# Patient Record
Sex: Female | Born: 1945 | Race: Black or African American | Hispanic: No | Smoking: Never smoker
Health system: Southern US, Community
[De-identification: ages and names within clinical notes are randomized; demographics above are authoritative.]

## PROBLEM LIST (undated history)

## (undated) DIAGNOSIS — E7800 Pure hypercholesterolemia, unspecified: Secondary | ICD-10-CM

## (undated) DIAGNOSIS — I428 Other cardiomyopathies: Secondary | ICD-10-CM

## (undated) DIAGNOSIS — I10 Essential (primary) hypertension: Secondary | ICD-10-CM

## (undated) DIAGNOSIS — N183 Chronic kidney disease, stage 3 unspecified: Secondary | ICD-10-CM

## (undated) DIAGNOSIS — D649 Anemia, unspecified: Secondary | ICD-10-CM

## (undated) DIAGNOSIS — I5042 Chronic combined systolic (congestive) and diastolic (congestive) heart failure: Secondary | ICD-10-CM

## (undated) DIAGNOSIS — I351 Nonrheumatic aortic (valve) insufficiency: Secondary | ICD-10-CM

## (undated) DIAGNOSIS — I340 Nonrheumatic mitral (valve) insufficiency: Secondary | ICD-10-CM

## (undated) DIAGNOSIS — Z9289 Personal history of other medical treatment: Secondary | ICD-10-CM

## (undated) DIAGNOSIS — M1990 Unspecified osteoarthritis, unspecified site: Secondary | ICD-10-CM

## (undated) DIAGNOSIS — I2510 Atherosclerotic heart disease of native coronary artery without angina pectoris: Secondary | ICD-10-CM

## (undated) DIAGNOSIS — M109 Gout, unspecified: Secondary | ICD-10-CM

## (undated) DIAGNOSIS — E119 Type 2 diabetes mellitus without complications: Secondary | ICD-10-CM

## (undated) DIAGNOSIS — I509 Heart failure, unspecified: Secondary | ICD-10-CM

## (undated) HISTORY — PX: CARPAL TUNNEL RELEASE: SHX101

## (undated) HISTORY — PX: MENISCUS REPAIR: SHX5179

## (undated) HISTORY — DX: Nonrheumatic aortic (valve) insufficiency: I35.1

## (undated) HISTORY — PX: TUBAL LIGATION: SHX77

## (undated) HISTORY — DX: Nonrheumatic mitral (valve) insufficiency: I34.0

## (undated) HISTORY — DX: Atherosclerotic heart disease of native coronary artery without angina pectoris: I25.10

## (undated) HISTORY — DX: Chronic combined systolic (congestive) and diastolic (congestive) heart failure: I50.42

## (undated) HISTORY — PX: THYROID LOBECTOMY: SHX420

## (undated) HISTORY — DX: Chronic kidney disease, stage 3 unspecified: N18.30

## (undated) HISTORY — PX: JOINT REPLACEMENT: SHX530

## (undated) HISTORY — PX: ABDOMINAL HYSTERECTOMY: SHX81

---

## 1998-03-17 HISTORY — PX: TOTAL HIP ARTHROPLASTY: SHX124

## 2004-01-12 ENCOUNTER — Ambulatory Visit: Payer: Self-pay | Admitting: Family Medicine

## 2004-01-29 ENCOUNTER — Ambulatory Visit: Payer: Self-pay | Admitting: Pulmonary Disease

## 2004-01-29 ENCOUNTER — Ambulatory Visit (HOSPITAL_BASED_OUTPATIENT_CLINIC_OR_DEPARTMENT_OTHER): Admission: RE | Admit: 2004-01-29 | Discharge: 2004-01-29 | Payer: Self-pay | Admitting: Family Medicine

## 2004-02-13 ENCOUNTER — Ambulatory Visit: Payer: Self-pay | Admitting: Family Medicine

## 2004-10-24 ENCOUNTER — Ambulatory Visit: Payer: Self-pay | Admitting: Family Medicine

## 2004-11-06 ENCOUNTER — Ambulatory Visit: Payer: Self-pay | Admitting: Family Medicine

## 2005-01-09 ENCOUNTER — Ambulatory Visit: Payer: Self-pay | Admitting: Family Medicine

## 2005-01-21 ENCOUNTER — Ambulatory Visit: Payer: Self-pay | Admitting: Family Medicine

## 2005-03-28 ENCOUNTER — Ambulatory Visit: Payer: Self-pay | Admitting: Family Medicine

## 2005-06-04 ENCOUNTER — Ambulatory Visit: Payer: Self-pay | Admitting: Family Medicine

## 2015-05-04 DIAGNOSIS — M109 Gout, unspecified: Secondary | ICD-10-CM | POA: Insufficient documentation

## 2015-06-03 DIAGNOSIS — I10 Essential (primary) hypertension: Secondary | ICD-10-CM | POA: Insufficient documentation

## 2015-06-03 DIAGNOSIS — G4733 Obstructive sleep apnea (adult) (pediatric): Secondary | ICD-10-CM | POA: Insufficient documentation

## 2015-06-03 DIAGNOSIS — E782 Mixed hyperlipidemia: Secondary | ICD-10-CM | POA: Insufficient documentation

## 2015-06-22 ENCOUNTER — Emergency Department (HOSPITAL_COMMUNITY): Payer: Medicare Other

## 2015-06-22 ENCOUNTER — Encounter (HOSPITAL_COMMUNITY): Payer: Self-pay

## 2015-06-22 ENCOUNTER — Inpatient Hospital Stay (HOSPITAL_COMMUNITY)
Admission: EM | Admit: 2015-06-22 | Discharge: 2015-06-27 | DRG: 287 | Disposition: A | Discharge status: Home / Self Care | Payer: Medicare Other | Attending: Cardiology | Admitting: Cardiology

## 2015-06-22 DIAGNOSIS — R000 Tachycardia, unspecified: Secondary | ICD-10-CM | POA: Diagnosis not present

## 2015-06-22 DIAGNOSIS — I272 Other secondary pulmonary hypertension: Secondary | ICD-10-CM | POA: Diagnosis present

## 2015-06-22 DIAGNOSIS — R7989 Other specified abnormal findings of blood chemistry: Secondary | ICD-10-CM

## 2015-06-22 DIAGNOSIS — I420 Dilated cardiomyopathy: Secondary | ICD-10-CM | POA: Diagnosis present

## 2015-06-22 DIAGNOSIS — Z7984 Long term (current) use of oral hypoglycemic drugs: Secondary | ICD-10-CM | POA: Diagnosis not present

## 2015-06-22 DIAGNOSIS — I509 Heart failure, unspecified: Secondary | ICD-10-CM

## 2015-06-22 DIAGNOSIS — R0602 Shortness of breath: Secondary | ICD-10-CM | POA: Diagnosis not present

## 2015-06-22 DIAGNOSIS — Z888 Allergy status to other drugs, medicaments and biological substances status: Secondary | ICD-10-CM

## 2015-06-22 DIAGNOSIS — I5021 Acute systolic (congestive) heart failure: Secondary | ICD-10-CM

## 2015-06-22 DIAGNOSIS — I214 Non-ST elevation (NSTEMI) myocardial infarction: Secondary | ICD-10-CM | POA: Diagnosis not present

## 2015-06-22 DIAGNOSIS — I5041 Acute combined systolic (congestive) and diastolic (congestive) heart failure: Secondary | ICD-10-CM | POA: Diagnosis not present

## 2015-06-22 DIAGNOSIS — I110 Hypertensive heart disease with heart failure: Principal | ICD-10-CM

## 2015-06-22 DIAGNOSIS — E118 Type 2 diabetes mellitus with unspecified complications: Secondary | ICD-10-CM | POA: Diagnosis present

## 2015-06-22 DIAGNOSIS — Z79899 Other long term (current) drug therapy: Secondary | ICD-10-CM | POA: Diagnosis not present

## 2015-06-22 DIAGNOSIS — E785 Hyperlipidemia, unspecified: Secondary | ICD-10-CM | POA: Diagnosis present

## 2015-06-22 DIAGNOSIS — I313 Pericardial effusion (noninflammatory): Secondary | ICD-10-CM | POA: Diagnosis present

## 2015-06-22 DIAGNOSIS — M109 Gout, unspecified: Secondary | ICD-10-CM | POA: Diagnosis present

## 2015-06-22 DIAGNOSIS — D649 Anemia, unspecified: Secondary | ICD-10-CM | POA: Diagnosis present

## 2015-06-22 DIAGNOSIS — I2510 Atherosclerotic heart disease of native coronary artery without angina pectoris: Secondary | ICD-10-CM | POA: Diagnosis not present

## 2015-06-22 DIAGNOSIS — R0789 Other chest pain: Secondary | ICD-10-CM | POA: Diagnosis not present

## 2015-06-22 DIAGNOSIS — I10 Essential (primary) hypertension: Secondary | ICD-10-CM | POA: Diagnosis present

## 2015-06-22 DIAGNOSIS — E1169 Type 2 diabetes mellitus with other specified complication: Secondary | ICD-10-CM | POA: Diagnosis present

## 2015-06-22 DIAGNOSIS — E876 Hypokalemia: Secondary | ICD-10-CM | POA: Diagnosis present

## 2015-06-22 DIAGNOSIS — Z96642 Presence of left artificial hip joint: Secondary | ICD-10-CM | POA: Diagnosis present

## 2015-06-22 DIAGNOSIS — R778 Other specified abnormalities of plasma proteins: Secondary | ICD-10-CM

## 2015-06-22 HISTORY — DX: Pure hypercholesterolemia, unspecified: E78.00

## 2015-06-22 HISTORY — DX: Unspecified osteoarthritis, unspecified site: M19.90

## 2015-06-22 HISTORY — DX: Essential (primary) hypertension: I10

## 2015-06-22 HISTORY — DX: Type 2 diabetes mellitus without complications: E11.9

## 2015-06-22 HISTORY — DX: Personal history of other medical treatment: Z92.89

## 2015-06-22 HISTORY — DX: Gout, unspecified: M10.9

## 2015-06-22 HISTORY — DX: Anemia, unspecified: D64.9

## 2015-06-22 HISTORY — DX: Other cardiomyopathies: I42.8

## 2015-06-22 HISTORY — DX: Heart failure, unspecified: I50.9

## 2015-06-22 LAB — BASIC METABOLIC PANEL
Anion gap: 13 (ref 5–15)
BUN: 13 mg/dL (ref 6–20)
CO2: 23 mmol/L (ref 22–32)
Calcium: 9.8 mg/dL (ref 8.9–10.3)
Chloride: 106 mmol/L (ref 101–111)
Creatinine, Ser: 1.07 mg/dL — ABNORMAL HIGH (ref 0.44–1.00)
GFR calc Af Amer: 60 mL/min — ABNORMAL LOW (ref >60)
GFR calc non Af Amer: 51 mL/min — ABNORMAL LOW (ref >60)
Glucose, Bld: 152 mg/dL — ABNORMAL HIGH (ref 65–99)
Potassium: 3.1 mmol/L — ABNORMAL LOW (ref 3.5–5.1)
Sodium: 142 mmol/L (ref 135–145)

## 2015-06-22 LAB — CBC
HCT: 37 % (ref 36.0–46.0)
Hemoglobin: 13 g/dL (ref 12.0–15.0)
MCH: 30.2 pg (ref 26.0–34.0)
MCHC: 35.1 g/dL (ref 30.0–36.0)
MCV: 85.8 fL (ref 78.0–100.0)
Platelets: 327 10*3/uL (ref 150–400)
RBC: 4.31 MIL/uL (ref 3.87–5.11)
RDW: 17.7 % — ABNORMAL HIGH (ref 11.5–15.5)
WBC: 6.3 10*3/uL (ref 4.0–10.5)

## 2015-06-22 LAB — I-STAT TROPONIN, ED: Troponin i, poc: 0.15 ng/mL (ref 0.00–0.08)

## 2015-06-22 LAB — TROPONIN I
TROPONIN I: 0.13 ng/mL — AB (ref <0.031)
Troponin I: 0.28 ng/mL — ABNORMAL HIGH (ref <0.031)

## 2015-06-22 LAB — ECHOCARDIOGRAM COMPLETE
Height: 61 in
Weight: 3056 oz

## 2015-06-22 LAB — GLUCOSE, CAPILLARY: Glucose-Capillary: 134 mg/dL — ABNORMAL HIGH (ref 65–99)

## 2015-06-22 LAB — BRAIN NATRIURETIC PEPTIDE: B Natriuretic Peptide: 1001.5 pg/mL — ABNORMAL HIGH (ref 0.0–100.0)

## 2015-06-22 LAB — HEPARIN LEVEL (UNFRACTIONATED): HEPARIN UNFRACTIONATED: 0.36 [IU]/mL (ref 0.30–0.70)

## 2015-06-22 MED ORDER — HEPARIN BOLUS VIA INFUSION
4000.0000 [IU] | Freq: Once | INTRAVENOUS | Status: AC
  Administered 2015-06-22: 4000 [IU] via INTRAVENOUS
  Filled 2015-06-22: qty 4000

## 2015-06-22 MED ORDER — NITROGLYCERIN IN D5W 200-5 MCG/ML-% IV SOLN
0.0000 ug/min | Freq: Once | INTRAVENOUS | Status: AC
  Administered 2015-06-22: 10 ug/min via INTRAVENOUS
  Filled 2015-06-22: qty 250

## 2015-06-22 MED ORDER — ONDANSETRON HCL 4 MG/2ML IJ SOLN: 4.0000 mg | Freq: Four times a day (QID) | INTRAMUSCULAR | Status: DC | PRN

## 2015-06-22 MED ORDER — HEPARIN (PORCINE) IN NACL 100-0.45 UNIT/ML-% IJ SOLN
1150.0000 [IU]/h | INTRAMUSCULAR | Status: DC
  Administered 2015-06-22: 850 [IU]/h via INTRAVENOUS
  Filled 2015-06-22 (×3): qty 250

## 2015-06-22 MED ORDER — ATORVASTATIN CALCIUM 20 MG PO TABS
20.0000 mg | ORAL_TABLET | Freq: Every day | ORAL | Status: DC
  Administered 2015-06-23 – 2015-06-26 (×4): 20 mg via ORAL
  Filled 2015-06-22 (×4): qty 1

## 2015-06-22 MED ORDER — SODIUM CHLORIDE 0.9% FLUSH
3.0000 mL | Freq: Two times a day (BID) | INTRAVENOUS | Status: DC
  Administered 2015-06-25 – 2015-06-26 (×2): 3 mL via INTRAVENOUS

## 2015-06-22 MED ORDER — ASPIRIN 81 MG PO CHEW: 81.0000 mg | CHEWABLE_TABLET | ORAL | Status: DC

## 2015-06-22 MED ORDER — SODIUM CHLORIDE 0.9% FLUSH
3.0000 mL | Freq: Two times a day (BID) | INTRAVENOUS | Status: DC
  Administered 2015-06-23 – 2015-06-24 (×2): 3 mL via INTRAVENOUS

## 2015-06-22 MED ORDER — COLCHICINE 0.6 MG PO TABS: 0.6000 mg | ORAL_TABLET | Freq: Every day | ORAL | Status: DC | PRN

## 2015-06-22 MED ORDER — SODIUM CHLORIDE 0.9% FLUSH
3.0000 mL | INTRAVENOUS | Status: DC | PRN
Start: 2015-06-22 — End: 2015-06-27

## 2015-06-22 MED ORDER — NITROGLYCERIN IN D5W 200-5 MCG/ML-% IV SOLN: 0.0000 ug/min | Freq: Once | INTRAVENOUS | Status: DC

## 2015-06-22 MED ORDER — FEBUXOSTAT 40 MG PO TABS
40.0000 mg | ORAL_TABLET | Freq: Every day | ORAL | Status: DC
  Administered 2015-06-23 – 2015-06-27 (×4): 40 mg via ORAL
  Filled 2015-06-22 (×4): qty 1

## 2015-06-22 MED ORDER — POTASSIUM CHLORIDE CRYS ER 20 MEQ PO TBCR
60.0000 meq | EXTENDED_RELEASE_TABLET | Freq: Once | ORAL | Status: AC
  Administered 2015-06-22: 60 meq via ORAL
  Filled 2015-06-22: qty 3

## 2015-06-22 MED ORDER — METOPROLOL SUCCINATE ER 25 MG PO TB24
12.5000 mg | ORAL_TABLET | Freq: Every day | ORAL | Status: DC
  Administered 2015-06-22 – 2015-06-26 (×4): 12.5 mg via ORAL
  Filled 2015-06-22 (×4): qty 1

## 2015-06-22 MED ORDER — SODIUM CHLORIDE 0.9 % IV SOLN: 250.0000 mL | INTRAVENOUS | Status: DC | PRN

## 2015-06-22 MED ORDER — FUROSEMIDE 10 MG/ML IJ SOLN
60.0000 mg | Freq: Once | INTRAMUSCULAR | Status: AC
  Administered 2015-06-22: 60 mg via INTRAVENOUS
  Filled 2015-06-22: qty 6

## 2015-06-22 MED ORDER — ASPIRIN EC 81 MG PO TBEC
81.0000 mg | DELAYED_RELEASE_TABLET | Freq: Every day | ORAL | Status: DC
  Administered 2015-06-22 – 2015-06-27 (×5): 81 mg via ORAL
  Filled 2015-06-22 (×5): qty 1

## 2015-06-22 MED ORDER — POTASSIUM CHLORIDE CRYS ER 20 MEQ PO TBCR
40.0000 meq | EXTENDED_RELEASE_TABLET | Freq: Every day | ORAL | Status: DC
  Administered 2015-06-22 – 2015-06-27 (×5): 40 meq via ORAL
  Filled 2015-06-22 (×6): qty 2

## 2015-06-22 MED ORDER — SODIUM CHLORIDE 0.9 % IV SOLN
250.0000 mL | INTRAVENOUS | Status: DC | PRN
Start: 2015-06-22 — End: 2015-06-27

## 2015-06-22 MED ORDER — FUROSEMIDE 10 MG/ML IJ SOLN
60.0000 mg | Freq: Every day | INTRAMUSCULAR | Status: DC
  Administered 2015-06-22 – 2015-06-26 (×4): 60 mg via INTRAVENOUS
  Filled 2015-06-22 (×4): qty 6

## 2015-06-22 MED ORDER — NITROGLYCERIN 0.4 MG SL SUBL: 0.4000 mg | SUBLINGUAL_TABLET | SUBLINGUAL | Status: DC | PRN

## 2015-06-22 MED ORDER — HEPARIN SODIUM (PORCINE) 5000 UNIT/ML IJ SOLN: 5000.0000 [IU] | Freq: Three times a day (TID) | INTRAMUSCULAR | Status: DC

## 2015-06-22 MED ORDER — NITROGLYCERIN IN D5W 200-5 MCG/ML-% IV SOLN
0.0000 ug/min | INTRAVENOUS | Status: DC
Start: 2015-06-22 — End: 2015-06-24
  Administered 2015-06-22: 30 ug/min via INTRAVENOUS
  Filled 2015-06-22 (×2): qty 250

## 2015-06-22 MED ORDER — ACETAMINOPHEN 325 MG PO TABS
650.0000 mg | ORAL_TABLET | ORAL | Status: DC | PRN
  Administered 2015-06-22 – 2015-06-26 (×6): 650 mg via ORAL
  Filled 2015-06-22 (×6): qty 2

## 2015-06-22 MED ORDER — ASPIRIN 81 MG PO CHEW: 324.0000 mg | CHEWABLE_TABLET | Freq: Once | ORAL | Status: DC

## 2015-06-22 MED ORDER — SODIUM CHLORIDE 0.9 % IV SOLN: INTRAVENOUS | Status: DC

## 2015-06-22 MED ORDER — IRBESARTAN 300 MG PO TABS
300.0000 mg | ORAL_TABLET | Freq: Every day | ORAL | Status: DC
  Administered 2015-06-23 – 2015-06-27 (×4): 300 mg via ORAL
  Filled 2015-06-22 (×4): qty 1

## 2015-06-22 MED ORDER — SODIUM CHLORIDE 0.9% FLUSH: 3.0000 mL | INTRAVENOUS | Status: DC | PRN

## 2015-06-22 NOTE — ED Notes (Signed)
Dr. Juleen China, ED at bedside.

## 2015-06-22 NOTE — ED Notes (Signed)
Pt ambulatory w/ steady gait to restroom. 

## 2015-06-22 NOTE — Progress Notes (Signed)
ANTICOAGULATION CONSULT NOTE  Pharmacy Consult for heparin Indication: chest pain/ACS  Allergies  Allergen Reactions  . Atorvastatin     Other reaction(s): GI Upset (intolerance) Chest discomfort  . Carvedilol Other (See Comments)    myalgias  . Hydrochlorothiazide Other (See Comments)    hypokalemia  . Lisinopril Cough    Patient Measurements: Height: 5' 1.5" (156.2 cm) Weight: 188 lb 12.8 oz (85.639 kg) IBW/kg (Calculated) : 48.95 Heparin Dosing Weight: 67.8 kg  Vital Signs: Temp: 98.4 F (36.9 C) (04/07 1542) Temp Source: Oral (04/07 1542) BP: 127/87 mmHg (04/07 1542) Pulse Rate: 106 (04/07 1542)  Labs:  Recent Labs  06/22/15 1040 06/22/15 1904  HGB 13.0  --   HCT 37.0  --   PLT 327  --   HEPARINUNFRC  --  0.36  CREATININE 1.07*  --     Estimated Creatinine Clearance: 49.1 mL/min (by C-G formula based on Cr of 1.07).  Assessment: 70 yo F in ED from [facility] with complains of CP and SOB.  Mild elevation in troponin.   First HL in therapeutic range at 0.36units/mL. No bleed noted.  Goal of Therapy:  Heparin level 0.3-0.7 units/ml Monitor platelets by anticoagulation protocol: Yes   Plan:  Contnue heparin infusion at 850 units/hr Next HL with AM labs Daily HL and CBC Follow for cardiology plans  Ahmira Boisselle D. Maximiano Lott, PharmD, BCPS Clinical Pharmacist Pager: [phone] 06/22/2015 7:49 PM

## 2015-06-22 NOTE — ED Notes (Signed)
Per EMS - coming from [facility]. Pt c/o chest pain x few days, thought r/t cough. Pt 12-lead unremarkable. Sent to [facility] for further evaluation. Pt now also c/o shortness of breath, lungs clear. Hx HTN, diabetes, gout. Sinus tach, BP 158/96, hr 112  324mg  aspirin. Pain 4/10 to 0/10 to 1/10.

## 2015-06-22 NOTE — Progress Notes (Signed)
  Echocardiogram 2D Echocardiogram has been performed.  Jennifer Kaufman 06/22/2015, 3:12 PM

## 2015-06-22 NOTE — ED Provider Notes (Signed)
CSN: [id number]     Arrival date & time 06/22/15  1031 History   First MD Initiated Contact with Patient 06/22/15 1032     Chief Complaint  Patient presents with  . Chest Pain  . Shortness of Breath     (Consider location/radiation/quality/duration/timing/severity/associated sxs/prior Treatment) HPI   69yF with dyspnea. Onset about a week ago and progressive. Worse with now just minimal exertion. "Did not sleep" the past two nights. Orthopnea and nonproductive cough. Chest "discomfort" in the center of her chest. She says it's not really painful and questions wether the feeling is because she cannot quite catch her breath. No fever or chills. Some mild LE swelling. No LE/calf pain. Does not weigh herself regularly. Hx of DM, HTN, hyperlipidemia and gout. No known CAD. Does not have a cardiologist. PCP Dr. Sol Passer through [place].   Past Medical History  Diagnosis Date  . Hypertension   . Diabetes mellitus without complication (HCC)   . Gout    Past Surgical History  Procedure Laterality Date  . Total hip arthroplasty Left 2000   No family history on file. Social History  Substance Use Topics  . Smoking status: Never Smoker   . Smokeless tobacco: Not on file  . Alcohol Use: No   OB History    No data available     Review of Systems  All systems reviewed and negative, other than as noted in HPI.   Allergies  Atorvastatin; Carvedilol; Hydrochlorothiazide; and Lisinopril  Home Medications   Prior to Admission medications   Not on File   BP 161/108 mmHg  Pulse 115  Temp(Src) 98.6 F (37 C)  Resp 37  Ht  (1.549 m)  Wt 191 lb (86.637 kg)  BMI 36.11 kg/m2  SpO2 100% Physical Exam  Constitutional: She appears well-developed and well-nourished. No distress.  HENT:  Head: Normocephalic and atraumatic.  Eyes: Conjunctivae are normal. Right eye exhibits no discharge. Left eye exhibits no discharge.  Neck: Neck supple.  Cardiovascular: Regular rhythm and normal  heart sounds.  Exam reveals no gallop and no friction rub.   No murmur heard. Tachycardia. No murmur appreciated.  Pulmonary/Chest:  Tachypnea. Decreased breath sounds b/l bases.   Abdominal: Soft. She exhibits no distension. There is no tenderness.  Musculoskeletal: She exhibits edema. She exhibits no tenderness.  Neurological: She is alert.  Skin: Skin is warm and dry.  Psychiatric: She has a normal mood and affect. Her behavior is normal. Thought content normal.  Nursing note and vitals reviewed.   ED Course  Procedures (including critical care time)  CRITICAL CARE Performed by: Raeford Razor Total critical care time: 35 minutes Critical care time was exclusive of separately billable procedures and treating other patients. Critical care was necessary to treat or prevent imminent or life-threatening deterioration. Critical care was time spent personally by me on the following activities: development of treatment plan with patient and/or surrogate as well as nursing, discussions with consultants, evaluation of patient's response to treatment, examination of patient, obtaining history from patient or surrogate, ordering and performing treatments and interventions, ordering and review of laboratory studies, ordering and review of radiographic studies, pulse oximetry and re-evaluation of patient's condition.  Labs Review Labs Reviewed  BASIC METABOLIC PANEL - Abnormal; Notable for the following:    Potassium 3.1 (*)    Glucose, Bld 152 (*)    Creatinine, Ser 1.07 (*)    GFR calc non Af Amer 51 (*)    GFR calc Af Amer 60 (*)  All other components within normal limits  CBC - Abnormal; Notable for the following:    RDW 17.7 (*)    All other components within normal limits  BRAIN NATRIURETIC PEPTIDE - Abnormal; Notable for the following:    B Natriuretic Peptide 1001.5 (*)    All other components within normal limits  I-STAT TROPOININ, ED - Abnormal; Notable for the following:     Troponin i, poc 0.15 (*)    All other components within normal limits  HEPARIN LEVEL (UNFRACTIONATED)    Imaging Review Dg Chest 2 View  06/22/2015  CLINICAL DATA:  Shortness of Breath EXAM: CHEST  2 VIEW COMPARISON:  08/01/2011 FINDINGS: Cardiomegaly is noted. Central vascular congestion and mild perihilar interstitial prominence highly suspicious for mild pulmonary edema. Probable small bilateral pleural effusion with bilateral basilar atelectasis or infiltrate. Degenerative changes mid and lower thoracic spine. IMPRESSION: Central vascular congestion. Mild perihilar interstitial prominence highly suspicious for pulmonary edema. Question small bilateral pleural effusion. Bilateral basilar atelectasis or infiltrate. Electronically Signed   By: Natasha Mead M.D.   On: 06/22/2015 11:40   I have personally reviewed and evaluated these images and lab results as part of my medical decision-making.   EKG Interpretation   Date/Time:  Friday June 22 2015 10:35:33 EDT Ventricular Rate:  116 PR Interval:  154 QRS Duration: 105 QT Interval:  350 QTC Calculation: 486 R Axis:   -46 Text Interpretation:  Sinus tachycardia Left anterior fascicular block  Repol abnrm suggests ischemia, anterolateral Baseline wander in lead(s) II  III aVR aVL aVF V3 V4 V5 limiting interpretation No old tracing to compare  Confirmed by Mashanda Ishibashi  MD, Dmoni Fortson ([id number]) on 06/22/2015 10:44:34 AM      MDM   Final diagnoses:  Acute heart failure, unspecified heart failure type (HCC)  Elevated troponin    69yF with dyspnea.  -Suspect HF. Progressive dyspnea over past week or so. Orthopnea. Occasional nonproductive cough. Mild LE edema. -Tachypnea and seems somewhat uncomfortable. Will start lasix and also nitro gtt with BP 160-180/100+. If does not begin to show improvement with this, may need bipap. -Chest "discomfort."  Hesitant to call it pain. Ischemic changes on EKG. ASA prehospital. Mild elevation in troponin. No known  CAD. Also consider demand ischemia. Will start heparin. Cardiology consultation. Will ultimately need admission.    -CXR: heart big, lungs wet. Continue aforementioned interventions. Cardiology to evaluate. Pt/daughter updated on my concerns and current plan. Her WOB has improved. Continue to monitor.     Raeford Razor, MD 06/25/15 [phone]

## 2015-06-22 NOTE — H&P (Addendum)
Patient ID: Jennifer Kaufman MRN: [id number], DOB/AGE: 1945/05/10   Admit date: 06/22/2015   Primary Physician: No primary care provider on file. Primary Cardiologist: new - Dr. Delton See  Pt. Profile:  pleasant 70 year old African-American female with past medical history of HTN, HLD, DM and gout however no past cardiac history presented with intermittent chest pressure and new onset of heart failure in the last week.  Problem List  Past Medical History  Diagnosis Date  . Hypertension   . Diabetes mellitus without complication (HCC)   . Gout     Past Surgical History  Procedure Laterality Date  . Total hip arthroplasty Left 2000     Allergies  Allergies  Allergen Reactions  . Atorvastatin     Other reaction(s): GI Upset (intolerance) Chest discomfort  . Carvedilol Other (See Comments)    myalgias  . Hydrochlorothiazide Other (See Comments)    hypokalemia  . Lisinopril Cough    HPI  Jennifer Kaufman is a pleasant 70 year old African-American female with past medical history of HTN, HLD, DM and gout however no past cardiac history. She lives by herself, and has been able to cook for herself and take care of herself without problem until last week. She began to notice increasing dyspnea on exertion. She also notices occasional chest tightness. The worst episode was 2 days ago where it lasted several hours. For the past 2 days, she has been unable to sleep due to significant orthopnea and paroxysmal nocturnal dyspnea. She also complains of shortness of breath even at rest along with lower extremity edema.  She was eventually seen by her PCP in [facility] on 06/22/2015 was sent to [facility] for further evaluation. Significant laboratory finding on arrival include potassium of 3.1, BNP 1001, troponin 0.15. Chest x-ray was concerning for pulmonary edema. EKG showed significant motion artifact, repeat EKG is currently pending, EKG obtained at this morning at  family physicians office shows significant T wave inversion in anterior leads. Cardiology consulted for heart failure.   Home Medications  Prior to Admission medications   Medication Sig Start Date End Date Taking? Authorizing Provider  atorvastatin (LIPITOR) 10 MG tablet Take 20 mg by mouth every morning.  05/23/15  Yes Historical Provider, MD  colchicine (COLCRYS) 0.6 MG tablet Take 0.6-1.2 mg by mouth daily as needed (1.2 mg at gout onset, 0.6 mg 1 hour later).  05/04/15  Yes Historical Provider, MD  metFORMIN (GLUCOPHAGE) 1000 MG tablet Take 1,000 mg by mouth 2 (two) times daily with a meal.  04/20/15  Yes Historical Provider, MD  ULORIC 40 MG tablet Take 40 mg by mouth daily.  06/05/15  Yes Historical Provider, MD  valsartan (DIOVAN) 320 MG tablet Take 320 mg by mouth daily.  06/14/15  Yes Historical Provider, MD    Family History  No family history on file.  Social History  Social History   Social History  . Marital Status: Married    Spouse Name: N/A  . Number of Children: N/A  . Years of Education: N/A   Occupational History  . Not on file.   Social History Main Topics  . Smoking status: Never Smoker   . Smokeless tobacco: Not on file  . Alcohol Use: No  . Drug Use: No  . Sexual Activity: Not on file   Other Topics Concern  . Not on file   Social History Narrative  . No narrative on file     Review of Systems General:  No chills, fever, night sweats or weight changes.  Cardiovascular:  No chest pain, palpitations. +DOE, LE edema, orthopnea, PND Dermatological: No rash, lesions/masses Respiratory: No cough +dyspnea Urologic: No hematuria, dysuria Abdominal:   No nausea, vomiting, diarrhea, bright red blood per rectum, melena, or hematemesis Neurologic:  No visual changes, wkns, changes in mental status. All other systems reviewed and are otherwise negative except as noted above.  Physical Exam  Blood pressure 138/85, pulse 108, temperature 98.6 F (37 C),  resp. rate 21, height 5\' 1"  (1.549 m), weight 191 lb (86.637 kg), SpO2 100 %.  General: Pleasant, NAD Psych: Normal affect. Neuro: Alert and oriented X 3. Moves all extremities spontaneously. HEENT: Normal  Neck: Supple without bruits or JVD. Lungs:  Resp regular and unlabored. + Markedly diminished breath sounds in bilateral bases Heart: RRR no s3, s4, or murmurs. Abdomen: Soft, non-tender, non-distended, BS + x 4.  Extremities: No clubbing, cyanosis. DP/PT/Radials 2+ and equal bilaterally. 1+ pitting edema in bilateral LE  Labs  Troponin (Point of Care Test)  Recent Labs  06/22/15 1052  TROPIPOC 0.15*   No results for input(s): CKTOTAL, CKMB, TROPONINI in the last 72 hours. Lab Results  Component Value Date   WBC 6.3 06/22/2015   HGB 13.0 06/22/2015   HCT 37.0 06/22/2015   MCV 85.8 06/22/2015   PLT 327 06/22/2015     Recent Labs Lab 06/22/15 1040  NA 142  K 3.1*  CL 106  CO2 23  BUN 13  CREATININE 1.07*  CALCIUM 9.8  GLUCOSE 152*   No results found for: CHOL, HDL, LDLCALC, TRIG No results found for: DDIMER   Radiology/Studies  Dg Chest 2 View  06/22/2015  CLINICAL DATA:  Shortness of Breath EXAM: CHEST  2 VIEW COMPARISON:  08/01/2011 FINDINGS: Cardiomegaly is noted. Central vascular congestion and mild perihilar interstitial prominence highly suspicious for mild pulmonary edema. Probable small bilateral pleural effusion with bilateral basilar atelectasis or infiltrate. Degenerative changes mid and lower thoracic spine. IMPRESSION: Central vascular congestion. Mild perihilar interstitial prominence highly suspicious for pulmonary edema. Question small bilateral pleural effusion. Bilateral basilar atelectasis or infiltrate. Electronically Signed   By: Natasha Mead M.D.   On: 06/22/2015 11:40   ECG  EKG obtained at [facility] family physicians has been reviewed, significant T wave inversion in V3 through V6.  Echocardiogram  pending   ASSESSMENT AND PLAN  1.  Acute heart faiilure: unknown systolic vs diastolic, based on abnormal EKG suspect she has had significant underlying CAD and this is likely systolic in nature  - Has been noticing dyspnea on exertion for the past week, also noticed increasing sign of lower extremity edema, orthopnea and paroxysmal nocturnal dyspnea for the past 2 days. I am concerned about recent coronary event. Will discuss with M.D. potentially to obtain a bedside echo, if she is able to lay flat, may try to cath her today.  - She has markedly diminished bibasilar breath sounds, she will need aggressive IV diuresis.  2. HTN: On losartan home, apparently she has multiple allergies listed including carvedilol, hydrochlorothiazide and lisinopril. However most of it does not appears to be true allergies, she has cough with lisinopril, hypokalemia with hydrochlorothiazide, unclear why she has myalgia with carvedilol.  3. HLD: obtain lipid panel  4. DM: hold metformin  Signed, Azalee Course, PA-C 06/22/2015, 1:35 PM   The patient was seen, examined and discussed with Azalee Course, PA-C and I agree with the above.   A very pleasant 48-year-old female  with prior medical history of treated hypertension and hyperlipidemia and diabetes who presented with few weeks of progressively worsening shortness of breath at first on exertion eventually at rest and it progressed into personal nocturnal dyspnea and 3 pillow orthopnea for she basically can only breathe when she is sitting up. During the interview she keeps gasping for breath. She denies any chest pain now or in the past. She has been experiencing palpitations that start all sudden and make her feel short of breath and presyncopal however no syncope. She has never smoked and has no family history of premature coronary artery disease or sudden cardiac death.  On physical exam she has elevated JVDs up to her jaws, she has crackles up to the mid lungs and mild lower extremity edema. I have personally  performed bedside portable echocardiogram that shows significant hypokinesis of her anterior and anteroseptal walls. Echo is overall poor quality but estimated LVEF is about 30% with akinetic anterior and anteroseptal walls. Patient's EKG shows sinus tachycardia and significant ST depressions and negative T waves in anterolateral and inferior leads. Her troponin is mildly elevated at 0.15, her BNP 1000. Ideally this patient should go to cath lab today, but she wouldn't be able to lay flat. I reviewed with continuation of IV nitroglycerin, IV heparin, start aspirin and high-dose atorvastatin. Start Toprol-XL 25 mg by mouth daily as she stated that she had muscle pain with carvedilol in the past. Continue home dose of valsartan. We will obtain a full echo.   Lars Masson 06/22/2015

## 2015-06-22 NOTE — Progress Notes (Signed)
ANTICOAGULATION CONSULT NOTE - Initial Consult  Pharmacy Consult for heparin Indication: chest pain/ACS  Allergies  Allergen Reactions  . Atorvastatin     Chest discomfort  . Carvedilol Other (See Comments)    myalgias  . Hydrochlorothiazide Other (See Comments)    hypokalemia  . Lisinopril Cough    Patient Measurements: Height: 5\' 1"  (154.9 cm) Weight: 191 lb (86.637 kg) IBW/kg (Calculated) : 47.8 Heparin Dosing Weight: 67.8 kg  Vital Signs: Temp: 98.6 F (37 C) (04/07 1035) BP: 161/108 mmHg (04/07 1035) Pulse Rate: 115 (04/07 1035)  Labs:  Recent Labs  06/22/15 1040  HGB 13.0  HCT 37.0  PLT 327    CrCl cannot be calculated (Patient has no serum creatinine result on file.).   Medical History: Past Medical History  Diagnosis Date  . Hypertension   . Diabetes mellitus without complication (HCC)   . Gout     Assessment: 70 yo F in ED from [facility].  CP, SOB.  Mild elevation in troponin.  Pharmacy consulted to dose heparin for ACS.  Wt 86.6 kg, HDW 67.8 kg.  CBC WNL,  No bleeding reported.  Goal of Therapy:  Heparin level 0.3-0.7 units/ml Monitor platelets by anticoagulation protocol: Yes   Plan:  Give 4000 units bolus x 1 Start heparin infusion at 850 units/hr Check anti-Xa level in 8 hours and daily while on heparin Continue to monitor H&H and platelets  Heparin drip delivered to RN- started at 1121 am  Herby Abraham, Pharm.D. [phone] 06/22/2015 11:23 AM

## 2015-06-23 LAB — LIPID PANEL
CHOL/HDL RATIO: 3.6 ratio
CHOLESTEROL: 124 mg/dL (ref 0–200)
HDL: 34 mg/dL — AB (ref >40)
LDL Cholesterol: 67 mg/dL (ref 0–99)
TRIGLYCERIDES: 113 mg/dL (ref <150)
VLDL: 23 mg/dL (ref 0–40)

## 2015-06-23 LAB — GLUCOSE, CAPILLARY: Glucose-Capillary: 144 mg/dL — ABNORMAL HIGH (ref 65–99)

## 2015-06-23 LAB — CBC
HEMATOCRIT: 31.3 % — AB (ref 36.0–46.0)
Hemoglobin: 11 g/dL — ABNORMAL LOW (ref 12.0–15.0)
MCH: 30.2 pg (ref 26.0–34.0)
MCHC: 35.1 g/dL (ref 30.0–36.0)
MCV: 86 fL (ref 78.0–100.0)
Platelets: 263 10*3/uL (ref 150–400)
RBC: 3.64 MIL/uL — ABNORMAL LOW (ref 3.87–5.11)
RDW: 17.7 % — AB (ref 11.5–15.5)
WBC: 7.4 10*3/uL (ref 4.0–10.5)

## 2015-06-23 LAB — BASIC METABOLIC PANEL
Anion gap: 12 (ref 5–15)
BUN: 14 mg/dL (ref 6–20)
CO2: 26 mmol/L (ref 22–32)
CREATININE: 1.21 mg/dL — AB (ref 0.44–1.00)
Calcium: 9 mg/dL (ref 8.9–10.3)
Chloride: 105 mmol/L (ref 101–111)
GFR calc Af Amer: 51 mL/min — ABNORMAL LOW (ref >60)
GFR calc non Af Amer: 44 mL/min — ABNORMAL LOW (ref >60)
GLUCOSE: 140 mg/dL — AB (ref 65–99)
Potassium: 3.3 mmol/L — ABNORMAL LOW (ref 3.5–5.1)
Sodium: 143 mmol/L (ref 135–145)

## 2015-06-23 LAB — HEPARIN LEVEL (UNFRACTIONATED)
HEPARIN UNFRACTIONATED: 0.24 [IU]/mL — AB (ref 0.30–0.70)
Heparin Unfractionated: 0.23 IU/mL — ABNORMAL LOW (ref 0.30–0.70)
Heparin Unfractionated: 0.52 IU/mL (ref 0.30–0.70)

## 2015-06-23 LAB — TROPONIN I: TROPONIN I: 0.24 ng/mL — AB (ref <0.031)

## 2015-06-23 MED ORDER — SODIUM CHLORIDE 0.9 % IV SOLN: INTRAVENOUS | Status: DC

## 2015-06-23 MED ORDER — SPIRONOLACTONE 25 MG PO TABS
25.0000 mg | ORAL_TABLET | Freq: Every day | ORAL | Status: DC
  Administered 2015-06-23 – 2015-06-27 (×4): 25 mg via ORAL
  Filled 2015-06-23 (×4): qty 1

## 2015-06-23 MED ORDER — HEPARIN BOLUS VIA INFUSION
1000.0000 [IU] | Freq: Once | INTRAVENOUS | Status: AC
  Administered 2015-06-23: 1000 [IU] via INTRAVENOUS
  Filled 2015-06-23: qty 1000

## 2015-06-23 MED ORDER — POTASSIUM CHLORIDE 20 MEQ/15ML (10%) PO SOLN
40.0000 meq | Freq: Once | ORAL | Status: AC
  Administered 2015-06-23: 40 meq via ORAL
  Filled 2015-06-23: qty 30

## 2015-06-23 NOTE — Progress Notes (Signed)
ANTICOAGULATION CONSULT NOTE - Follow Up Consult  Pharmacy Consult for heparin Indication: chest pain/ACS   Labs:  Recent Labs  06/22/15 1040  06/22/15 1904 06/22/15 2226 06/23/15 0336 06/23/15 1153 06/23/15 2211  HGB 13.0  --   --   --  11.0*  --   --   HCT 37.0  --   --   --  31.3*  --   --   PLT 327  --   --   --  263  --   --   HEPARINUNFRC  --   < > 0.36  --  0.24* 0.23* 0.52  CREATININE 1.07*  --   --   --  1.21*  --   --   TROPONINI  --   --  0.13* 0.28* 0.24*  --   --   < > = values in this interval not displayed.   Assessment/Plan:  70yo female therapeutic on heparin after rate increases. Will continue gtt at current rate and confirm stable with am labs.   Vernard Gambles, PharmD, BCPS  06/23/2015,10:52 PM

## 2015-06-23 NOTE — Progress Notes (Signed)
ANTICOAGULATION CONSULT NOTE  Pharmacy Consult for heparin Indication: chest pain/ACS  Allergies  Allergen Reactions  . Atorvastatin     Other reaction(s): GI Upset (intolerance) Chest discomfort  . Carvedilol Other (See Comments)    myalgias  . Hydrochlorothiazide Other (See Comments)    hypokalemia  . Lisinopril Cough    Patient Measurements: Height: 5' 1.5" (156.2 cm) Weight: 185 lb 14.4 oz (84.324 kg) (scale c) IBW/kg (Calculated) : 48.95 Heparin Dosing Weight: 67.8 kg  Vital Signs: Temp: 99.9 F (37.7 C) (04/08 1204) Temp Source: Oral (04/08 1204) BP: 115/75 mmHg (04/08 1204) Pulse Rate: 86 (04/08 1204)  Labs:  Recent Labs  06/22/15 1040 06/22/15 1904 06/22/15 2226 06/23/15 0336 06/23/15 1153  HGB 13.0  --   --  11.0*  --   HCT 37.0  --   --  31.3*  --   PLT 327  --   --  263  --   HEPARINUNFRC  --  0.36  --  0.24* 0.23*  CREATININE 1.07*  --   --  1.21*  --   TROPONINI  --  0.13* 0.28* 0.24*  --     Estimated Creatinine Clearance: 43.1 mL/min (by C-G formula based on Cr of 1.21).  Assessment: 70 yo F in ED from [facility] with complaints of CP and SOB and a mild elevation in troponin. Pt also with new onset of acute CHF with EF 20-25%. Pt will likely undergo heart cath on Monday 06/25/15.  HL subtherapeutic at 0.23 despite increase in heparin rate. No issues with line or any off time per nurse. Hgb 13 > 11, Plt 327 > 263, but no bleeding reported per nurse.  Goal of Therapy:  Heparin level 0.3-0.7 units/ml Monitor platelets by anticoagulation protocol: Yes   Plan:  --Bolus heparin 1000 units x1 --Increase heparin to 1150 units/hr --Recheck HL in 8 hours --Daily HL and CBC --f/u cath on Monday  Arcola Jansky, PharmD Clinical Pharmacy Resident Pager: [phone]  06/23/2015 2:16 PM

## 2015-06-23 NOTE — Progress Notes (Addendum)
Subjective:  Still dyspneic with activity but overall feels better than she did before.  No chest pain currently today.  Objective:  Vital Signs in the last 24 hours: BP 115/70 mmHg  Pulse 99  Temp(Src) 98.2 F (36.8 C) (Oral)  Resp 18  Ht 5' 1.5" (1.562 m)  Wt 84.324 kg (185 lb 14.4 oz)  BMI 34.56 kg/m2  SpO2 95%  Physical Exam: Elderly black female in no acute distress Lungs: Mild rales bases  Cardiac:  Regular rhythm, normal S1 and S2, no S3 Abdomen:  Soft, nontender, no masses Extremities:  1+ edema present  Intake/Output from previous day: 04/07 0701 - 04/08 0700 In: 340 [P.O.:340] Out: 1150 [Urine:1150]  Weight Filed Weights   06/22/15 1035 06/22/15 1542 06/23/15 0357  Weight: 86.637 kg (191 lb) 85.639 kg (188 lb 12.8 oz) 84.324 kg (185 lb 14.4 oz)    Lab Results: Basic Metabolic Panel:  Recent Labs  56/38/75 1040 06/23/15 0336  NA 142 143  K 3.1* 3.3*  CL 106 105  CO2 23 26  GLUCOSE 152* 140*  BUN 13 14  CREATININE 1.07* 1.21*   CBC:  Recent Labs  06/22/15 1040 06/23/15 0336  WBC 6.3 7.4  HGB 13.0 11.0*  HCT 37.0 31.3*  MCV 85.8 86.0  PLT 327 263   Cardiac Enzymes: Troponin (Point of Care Test)  Recent Labs  06/22/15 1052  TROPIPOC 0.15*   Cardiac Panel (last 3 results)  Recent Labs  06/22/15 1904 06/22/15 2226 06/23/15 0336  TROPONINI 0.13* 0.28* 0.24*    Telemetry: Sinus rhythm  Assessment/Plan:  1.  Acute systolic congestive heart failure-echo shows EF of 20-25% with akinesis of the anterior wall.  Troponins are somewhat flat-unclear whether this is ischemic or due to congestive heart failure 2.  Wall motion abnormality on EKG 3.  Hypokalemia  Recommendations:  Continue intravenous diuresis.  Continue heparin and nitroglycerin.  Replete potassium and begin Spironolactone.  Already on ARB and beta blocker.  Will likely require catheterization on Monday.      Darden Palmer  MD [facility]  06/23/2015,  10:04 AM

## 2015-06-23 NOTE — Progress Notes (Signed)
ANTICOAGULATION CONSULT NOTE - Follow Up Consult  Pharmacy Consult for heparin Indication: chest pain/ACS   Labs:  Recent Labs  06/22/15 1040 06/22/15 1904 06/22/15 2226 06/23/15 0336  HGB 13.0  --   --  11.0*  HCT 37.0  --   --  31.3*  PLT 327  --   --  263  HEPARINUNFRC  --  0.36  --  0.24*  CREATININE 1.07*  --   --   --   TROPONINI  --  0.13* 0.28*  --      Assessment: 69yo female subtherapeutic on heparin after one level at lower end of goal.  Goal of Therapy:  Heparin level 0.3-0.7 units/ml   Plan:  Will increase heparin gtt by 1-2 units/kg/hr to 1000 units/hr and check level in 8hr.  Vernard Gambles, PharmD, BCPS  06/23/2015,4:19 AM

## 2015-06-24 LAB — CBC
HEMATOCRIT: 29.5 % — AB (ref 36.0–46.0)
HEMOGLOBIN: 10.6 g/dL — AB (ref 12.0–15.0)
MCH: 30.9 pg (ref 26.0–34.0)
MCHC: 35.9 g/dL (ref 30.0–36.0)
MCV: 86 fL (ref 78.0–100.0)
PLATELETS: 249 10*3/uL (ref 150–400)
RBC: 3.43 MIL/uL — AB (ref 3.87–5.11)
RDW: 17.8 % — AB (ref 11.5–15.5)
WBC: 7.4 10*3/uL (ref 4.0–10.5)

## 2015-06-24 LAB — HEPARIN LEVEL (UNFRACTIONATED): HEPARIN UNFRACTIONATED: 0.56 [IU]/mL (ref 0.30–0.70)

## 2015-06-24 LAB — IRON AND TIBC
Iron: 22 ug/dL — ABNORMAL LOW (ref 28–170)
SATURATION RATIOS: 9 % — AB (ref 10.4–31.8)
TIBC: 245 ug/dL — AB (ref 250–450)
UIBC: 223 ug/dL

## 2015-06-24 LAB — FERRITIN: Ferritin: 455 ng/mL — ABNORMAL HIGH (ref 11–307)

## 2015-06-24 LAB — BASIC METABOLIC PANEL
Anion gap: 14 (ref 5–15)
BUN: 13 mg/dL (ref 6–20)
CALCIUM: 9.1 mg/dL (ref 8.9–10.3)
CO2: 23 mmol/L (ref 22–32)
CREATININE: 1.17 mg/dL — AB (ref 0.44–1.00)
Chloride: 103 mmol/L (ref 101–111)
GFR calc non Af Amer: 46 mL/min — ABNORMAL LOW (ref >60)
GFR, EST AFRICAN AMERICAN: 53 mL/min — AB (ref >60)
Glucose, Bld: 134 mg/dL — ABNORMAL HIGH (ref 65–99)
Potassium: 3.6 mmol/L (ref 3.5–5.1)
Sodium: 140 mmol/L (ref 135–145)

## 2015-06-24 LAB — RETICULOCYTES
RBC.: 3.64 MIL/uL — ABNORMAL LOW (ref 3.87–5.11)
Retic Count, Absolute: 61.9 10*3/uL (ref 19.0–186.0)
Retic Ct Pct: 1.7 % (ref 0.4–3.1)

## 2015-06-24 LAB — VITAMIN B12: Vitamin B-12: 57 pg/mL — ABNORMAL LOW (ref 180–914)

## 2015-06-24 LAB — GLUCOSE, CAPILLARY: Glucose-Capillary: 136 mg/dL — ABNORMAL HIGH (ref 65–99)

## 2015-06-24 LAB — FOLATE: FOLATE: 18.2 ng/mL (ref >5.9)

## 2015-06-24 MED ORDER — SODIUM CHLORIDE 0.9% FLUSH: 3.0000 mL | INTRAVENOUS | Status: DC | PRN

## 2015-06-24 MED ORDER — SODIUM CHLORIDE 0.9 % IV SOLN
INTRAVENOUS | Status: DC
  Administered 2015-06-25: 06:00:00 via INTRAVENOUS

## 2015-06-24 MED ORDER — ASPIRIN 81 MG PO CHEW
81.0000 mg | CHEWABLE_TABLET | ORAL | Status: AC
  Administered 2015-06-25: 81 mg via ORAL
  Filled 2015-06-24: qty 1

## 2015-06-24 MED ORDER — SODIUM CHLORIDE 0.9 % IV SOLN: 250.0000 mL | INTRAVENOUS | Status: DC | PRN

## 2015-06-24 MED ORDER — SODIUM CHLORIDE 0.9% FLUSH
3.0000 mL | Freq: Two times a day (BID) | INTRAVENOUS | Status: DC
  Administered 2015-06-24: 3 mL via INTRAVENOUS

## 2015-06-24 NOTE — Progress Notes (Signed)
Subjective:  Complains of mild dyspnea today but no chest pain.  Objective:  Vital Signs in the last 24 hours: BP 103/71 mmHg  Pulse 100  Temp(Src) 98.4 F (36.9 C) (Oral)  Resp 18  Ht 5' 1.5" (1.562 m)  Wt 83.235 kg (183 lb 8 oz)  BMI 34.11 kg/m2  SpO2 99%  Physical Exam: Elderly black female in no acute distress Lungs: Mild rales bases  Cardiac:  Regular rhythm, normal S1 and S2, no S3 Abdomen:  Soft, nontender, no masses Extremities:  No edema present  Intake/Output from previous day: 04/08 0701 - 04/09 0700 In: 1224.7 [P.O.:340; I.V.:884.7] Out: 1675 [Urine:1675]  Weight Filed Weights   06/22/15 1542 06/23/15 0357 06/24/15 0500  Weight: 85.639 kg (188 lb 12.8 oz) 84.324 kg (185 lb 14.4 oz) 83.235 kg (183 lb 8 oz)    Lab Results: Basic Metabolic Panel:  Recent Labs  41/32/44 0336 06/24/15 0515  NA 143 140  K 3.3* 3.6  CL 105 103  CO2 26 23  GLUCOSE 140* 134*  BUN 14 13  CREATININE 1.21* 1.17*   CBC:  Recent Labs  06/23/15 0336 06/24/15 0515  WBC 7.4 7.4  HGB 11.0* 10.6*  HCT 31.3* 29.5*  MCV 86.0 86.0  PLT 263 249   Cardiac Enzymes: Troponin (Point of Care Test)  Recent Labs  06/22/15 1052  TROPIPOC 0.15*   Cardiac Panel (last 3 results)  Recent Labs  06/22/15 1904 06/22/15 2226 06/23/15 0336  TROPONINI 0.13* 0.28* 0.24*    Telemetry: Sinus rhythm  Assessment/Plan:  1.  Acute systolic congestive heart failure-echo shows EF of 20-25% with akinesis of the anterior wall.  Troponins are somewhat flat-unclear whether this is ischemic or due to congestive heart failure 2.  Wall motion abnormality on Echo 3.  Hypokalemia somewhat better 4.  Anemia  Recommendations:  Continue intravenous diuresis.  Continue heparin and nitroglycerin.  Cardiac catheterization was discussed with the patient fully including risks of myocardial infarction, death, stroke, bleeding, arrhythmia, dye allergy, renal insufficiency or bleeding.  The patient  understands and is willing to proceed.  Draw blood for anemia workup.   Darden Palmer  MD [facility]  06/24/2015, 9:37 AM

## 2015-06-24 NOTE — Progress Notes (Signed)
ANTICOAGULATION CONSULT NOTE  Pharmacy Consult for heparin Indication: chest pain/ACS  Allergies  Allergen Reactions  . Atorvastatin     Other reaction(s): GI Upset (intolerance) Chest discomfort  . Carvedilol Other (See Comments)    myalgias  . Hydrochlorothiazide Other (See Comments)    hypokalemia  . Lisinopril Cough    Patient Measurements: Height: 5' 1.5" (156.2 cm) Weight: 183 lb 8 oz (83.235 kg) IBW/kg (Calculated) : 48.95 Heparin Dosing Weight: 67.8 kg  Vital Signs: Temp: 98.4 F (36.9 C) (04/09 0417) Temp Source: Oral (04/09 0417) BP: 103/71 mmHg (04/09 0417) Pulse Rate: 100 (04/09 0417)  Labs:  Recent Labs  06/22/15 1040  06/22/15 1904 06/22/15 2226 06/23/15 0336 06/23/15 1153 06/23/15 2211 06/24/15 0515  HGB 13.0  --   --   --  11.0*  --   --  10.6*  HCT 37.0  --   --   --  31.3*  --   --  29.5*  PLT 327  --   --   --  263  --   --  249  HEPARINUNFRC  --   < > 0.36  --  0.24* 0.23* 0.52 0.56  CREATININE 1.07*  --   --   --  1.21*  --   --  1.17*  TROPONINI  --   --  0.13* 0.28* 0.24*  --   --   --   < > = values in this interval not displayed.  Estimated Creatinine Clearance: 44.3 mL/min (by C-G formula based on Cr of 1.17).  Assessment: 70 yo F in ED from [facility] with complaints of CP and SOB and a mild elevation in troponin. Pt also with new onset of acute CHF with EF 20-25%. Pt will likely undergo heart cath on Monday 06/25/15.  HL therapeutic at 0.56 on 1150 units/hr. Hgb 13 > 10.6, Plt 327 > 249, but no bleeding reported per nurse.  Goal of Therapy:  Heparin level 0.3-0.7 units/ml Monitor platelets by anticoagulation protocol: Yes   Plan:  --Continue heparin at 1150 units/hr --Daily HL and CBC --f/u cath on Monday 06/25/15  Arcola Jansky, PharmD Clinical Pharmacy Resident Pager: [phone]  06/24/2015 9:17 AM

## 2015-06-25 ENCOUNTER — Encounter (HOSPITAL_COMMUNITY): Payer: Self-pay | Admitting: Cardiology

## 2015-06-25 ENCOUNTER — Encounter (HOSPITAL_COMMUNITY)
Admission: EM | Disposition: A | Discharge status: Home / Self Care | Payer: Self-pay | Source: Home / Self Care | Attending: Cardiology

## 2015-06-25 DIAGNOSIS — I5041 Acute combined systolic (congestive) and diastolic (congestive) heart failure: Secondary | ICD-10-CM

## 2015-06-25 DIAGNOSIS — E118 Type 2 diabetes mellitus with unspecified complications: Secondary | ICD-10-CM | POA: Diagnosis present

## 2015-06-25 DIAGNOSIS — E785 Hyperlipidemia, unspecified: Secondary | ICD-10-CM | POA: Diagnosis present

## 2015-06-25 DIAGNOSIS — E1169 Type 2 diabetes mellitus with other specified complication: Secondary | ICD-10-CM | POA: Diagnosis present

## 2015-06-25 DIAGNOSIS — I420 Dilated cardiomyopathy: Secondary | ICD-10-CM | POA: Diagnosis present

## 2015-06-25 DIAGNOSIS — I10 Essential (primary) hypertension: Secondary | ICD-10-CM | POA: Diagnosis present

## 2015-06-25 DIAGNOSIS — I2510 Atherosclerotic heart disease of native coronary artery without angina pectoris: Secondary | ICD-10-CM

## 2015-06-25 HISTORY — PX: CARDIAC CATHETERIZATION: SHX172

## 2015-06-25 LAB — POCT ACTIVATED CLOTTING TIME
Activated Clotting Time: 291 seconds
Activated Clotting Time: 219 seconds

## 2015-06-25 LAB — CBC
HCT: 31.8 % — ABNORMAL LOW (ref 36.0–46.0)
HEMATOCRIT: 32.9 % — AB (ref 36.0–46.0)
HEMOGLOBIN: 11.4 g/dL — AB (ref 12.0–15.0)
HEMOGLOBIN: 11.6 g/dL — AB (ref 12.0–15.0)
MCH: 30.6 pg (ref 26.0–34.0)
MCH: 30.4 pg (ref 26.0–34.0)
MCHC: 35.8 g/dL (ref 30.0–36.0)
MCHC: 35.3 g/dL (ref 30.0–36.0)
MCV: 85.5 fL (ref 78.0–100.0)
MCV: 86.1 fL (ref 78.0–100.0)
PLATELETS: 274 10*3/uL (ref 150–400)
Platelets: 290 10*3/uL (ref 150–400)
RBC: 3.72 MIL/uL — ABNORMAL LOW (ref 3.87–5.11)
RBC: 3.82 MIL/uL — ABNORMAL LOW (ref 3.87–5.11)
RDW: 17.7 % — AB (ref 11.5–15.5)
RDW: 17.8 % — AB (ref 11.5–15.5)
WBC: 6.9 10*3/uL (ref 4.0–10.5)
WBC: 5.7 10*3/uL (ref 4.0–10.5)

## 2015-06-25 LAB — POCT I-STAT 3, ART BLOOD GAS (G3+)
Acid-Base Excess: 1 mmol/L (ref 0.0–2.0)
Bicarbonate: 25.9 mEq/L — ABNORMAL HIGH (ref 20.0–24.0)
O2 Saturation: 87 %
TCO2: 27 mmol/L (ref 0–100)
pCO2 arterial: 42.6 mmHg (ref 35.0–45.0)
pH, Arterial: 7.392 (ref 7.350–7.450)
pO2, Arterial: 54 mmHg — ABNORMAL LOW (ref 80.0–100.0)

## 2015-06-25 LAB — BASIC METABOLIC PANEL
Anion gap: 13 (ref 5–15)
BUN: 16 mg/dL (ref 6–20)
CALCIUM: 9.4 mg/dL (ref 8.9–10.3)
CO2: 25 mmol/L (ref 22–32)
CREATININE: 1.13 mg/dL — AB (ref 0.44–1.00)
Chloride: 103 mmol/L (ref 101–111)
GFR calc non Af Amer: 48 mL/min — ABNORMAL LOW (ref >60)
GFR, EST AFRICAN AMERICAN: 56 mL/min — AB (ref >60)
Glucose, Bld: 133 mg/dL — ABNORMAL HIGH (ref 65–99)
Potassium: 3.6 mmol/L (ref 3.5–5.1)
SODIUM: 141 mmol/L (ref 135–145)

## 2015-06-25 LAB — POCT I-STAT 3, VENOUS BLOOD GAS (G3P V)
Acid-Base Excess: 1 mmol/L (ref 0.0–2.0)
Bicarbonate: 26.4 mEq/L — ABNORMAL HIGH (ref 20.0–24.0)
O2 Saturation: 56 %
TCO2: 28 mmol/L (ref 0–100)
pCO2, Ven: 42.5 mmHg — ABNORMAL LOW (ref 45.0–50.0)
pH, Ven: 7.402 — ABNORMAL HIGH (ref 7.250–7.300)
pO2, Ven: 29 mmHg — ABNORMAL LOW (ref 31.0–45.0)

## 2015-06-25 LAB — CREATININE, SERUM
CREATININE: 1.17 mg/dL — AB (ref 0.44–1.00)
GFR calc Af Amer: 53 mL/min — ABNORMAL LOW (ref >60)
GFR calc non Af Amer: 46 mL/min — ABNORMAL LOW (ref >60)

## 2015-06-25 LAB — GLUCOSE, CAPILLARY
Glucose-Capillary: 128 mg/dL — ABNORMAL HIGH (ref 65–99)
Glucose-Capillary: 114 mg/dL — ABNORMAL HIGH (ref 65–99)
Glucose-Capillary: 120 mg/dL — ABNORMAL HIGH (ref 65–99)

## 2015-06-25 LAB — PROTIME-INR
INR: 1.21 (ref 0.00–1.49)
PROTHROMBIN TIME: 15.5 s — AB (ref 11.6–15.2)

## 2015-06-25 LAB — HEMOGLOBIN A1C
HEMOGLOBIN A1C: 6.4 % — AB (ref 4.8–5.6)
Mean Plasma Glucose: 137 mg/dL

## 2015-06-25 LAB — HEPARIN LEVEL (UNFRACTIONATED): HEPARIN UNFRACTIONATED: 0.55 [IU]/mL (ref 0.30–0.70)

## 2015-06-25 SURGERY — RIGHT/LEFT HEART CATH AND CORONARY ANGIOGRAPHY
Anesthesia: LOCAL

## 2015-06-25 MED ORDER — SODIUM CHLORIDE 0.9 % IV SOLN: 250.0000 mL | INTRAVENOUS | Status: DC | PRN

## 2015-06-25 MED ORDER — VERAPAMIL HCL 2.5 MG/ML IV SOLN
INTRAVENOUS | Status: DC | PRN
  Administered 2015-06-25: 10 mL via INTRA_ARTERIAL

## 2015-06-25 MED ORDER — VERAPAMIL HCL 2.5 MG/ML IV SOLN
INTRAVENOUS | Status: AC
  Filled 2015-06-25: qty 2

## 2015-06-25 MED ORDER — IOPAMIDOL (ISOVUE-370) INJECTION 76%
INTRAVENOUS | Status: AC
  Filled 2015-06-25: qty 100

## 2015-06-25 MED ORDER — MIDAZOLAM HCL 2 MG/2ML IJ SOLN
INTRAMUSCULAR | Status: DC | PRN
  Administered 2015-06-25: 1 mg via INTRAVENOUS

## 2015-06-25 MED ORDER — FENTANYL CITRATE (PF) 100 MCG/2ML IJ SOLN
INTRAMUSCULAR | Status: DC | PRN
  Administered 2015-06-25: 25 ug via INTRAVENOUS

## 2015-06-25 MED ORDER — HEPARIN (PORCINE) IN NACL 2-0.9 UNIT/ML-% IJ SOLN
INTRAMUSCULAR | Status: DC | PRN
  Administered 2015-06-25: 1500 mL

## 2015-06-25 MED ORDER — HEPARIN SODIUM (PORCINE) 1000 UNIT/ML IJ SOLN
INTRAMUSCULAR | Status: DC | PRN
  Administered 2015-06-25: 4000 [IU] via INTRAVENOUS
  Administered 2015-06-25: 3500 [IU] via INTRAVENOUS

## 2015-06-25 MED ORDER — ENOXAPARIN SODIUM 40 MG/0.4ML ~~LOC~~ SOLN
40.0000 mg | SUBCUTANEOUS | Status: DC
  Administered 2015-06-26 – 2015-06-27 (×2): 40 mg via SUBCUTANEOUS
  Filled 2015-06-25 (×2): qty 0.4

## 2015-06-25 MED ORDER — LIDOCAINE HCL (PF) 1 % IJ SOLN
INTRAMUSCULAR | Status: AC
  Filled 2015-06-25: qty 30

## 2015-06-25 MED ORDER — ZOLPIDEM TARTRATE 5 MG PO TABS
5.0000 mg | ORAL_TABLET | Freq: Once | ORAL | Status: AC
  Administered 2015-06-25: 5 mg via ORAL
  Filled 2015-06-25: qty 1

## 2015-06-25 MED ORDER — IOPAMIDOL (ISOVUE-370) INJECTION 76%
INTRAVENOUS | Status: DC | PRN
  Administered 2015-06-25: 95 mL via INTRAVENOUS

## 2015-06-25 MED ORDER — HEPARIN (PORCINE) IN NACL 2-0.9 UNIT/ML-% IJ SOLN
INTRAMUSCULAR | Status: AC
  Filled 2015-06-25: qty 1500

## 2015-06-25 MED ORDER — ADENOSINE 12 MG/4ML IV SOLN
16.0000 mL | Freq: Once | INTRAVENOUS | Status: DC
  Filled 2015-06-25: qty 16

## 2015-06-25 MED ORDER — HEPARIN SODIUM (PORCINE) 1000 UNIT/ML IJ SOLN
INTRAMUSCULAR | Status: AC
  Filled 2015-06-25: qty 1

## 2015-06-25 MED ORDER — LIDOCAINE HCL (PF) 1 % IJ SOLN
INTRAMUSCULAR | Status: DC | PRN
  Administered 2015-06-25: 2 mL
  Administered 2015-06-25: 18 mL

## 2015-06-25 MED ORDER — MIDAZOLAM HCL 2 MG/2ML IJ SOLN
INTRAMUSCULAR | Status: AC
  Filled 2015-06-25: qty 2

## 2015-06-25 MED ORDER — FENTANYL CITRATE (PF) 100 MCG/2ML IJ SOLN
INTRAMUSCULAR | Status: AC
  Filled 2015-06-25: qty 2

## 2015-06-25 MED ORDER — SODIUM CHLORIDE 0.9% FLUSH: 3.0000 mL | INTRAVENOUS | Status: DC | PRN

## 2015-06-25 MED ORDER — ADENOSINE (DIAGNOSTIC) 140MCG/KG/MIN
INTRAVENOUS | Status: DC | PRN
  Administered 2015-06-25: 140 ug/kg/min via INTRAVENOUS

## 2015-06-25 MED ORDER — SODIUM CHLORIDE 0.9 % WEIGHT BASED INFUSION: 1.0000 mL/kg/h | INTRAVENOUS | Status: AC

## 2015-06-25 MED ORDER — SODIUM CHLORIDE 0.9% FLUSH
3.0000 mL | Freq: Two times a day (BID) | INTRAVENOUS | Status: DC
  Administered 2015-06-25 – 2015-06-27 (×4): 3 mL via INTRAVENOUS

## 2015-06-25 SURGICAL SUPPLY — 16 items
CATH INFINITI 5 FR JL3.5 (CATHETERS) ×1 IMPLANT
CATH INFINITI JR4 5F (CATHETERS) ×1 IMPLANT
CATH MICROCATH NAVVUS (MICROCATHETER) IMPLANT
CATH SWAN GANZ 7F STRAIGHT (CATHETERS) ×2 IMPLANT
CATH VISTA GUIDE 6FR JR4 (CATHETERS) ×1 IMPLANT
GLIDESHEATH SLEND A-KIT 6F 22G (SHEATH) ×1 IMPLANT
KIT ESSENTIALS PG (KITS) ×1 IMPLANT
KIT HEART LEFT (KITS) ×2 IMPLANT
MICROCATHETER NAVVUS (MICROCATHETER) ×2
PACK CARDIAC CATHETERIZATION (CUSTOM PROCEDURE TRAY) ×2 IMPLANT
SHEATH PINNACLE 7F 10CM (SHEATH) ×1 IMPLANT
TRANSDUCER W/STOPCOCK (MISCELLANEOUS) ×3 IMPLANT
TUBING CIL FLEX 10 FLL-RA (TUBING) ×2 IMPLANT
WIRE EMERALD 3MM-J .025X260CM (WIRE) ×1 IMPLANT
WIRE HI TORQ BMW 190CM (WIRE) ×1 IMPLANT
WIRE SAFE-T 1.5MM-J .035X260CM (WIRE) ×1 IMPLANT

## 2015-06-25 NOTE — Interval H&P Note (Signed)
History and Physical Interval Note:  06/25/2015 8:58 AM  Jennifer Kaufman  has presented today for surgery, with the diagnosis of Cardiomyopathy with + Troponin.The various methods of treatment have been discussed with the patient and family. After consideration of risks, benefits and other options for treatment, the patient has consented to  Procedure(s): Right/Left Heart Cath and Coronary Angiography (N/A) with possible percutaneous coronary intervention. as a surgical intervention .  The patient's history has been reviewed, patient examined, no change in status, stable for surgery.  I have reviewed the patient's chart and labs.  Questions were answered to the patient's satisfaction.    Cath Lab Visit (complete for each Cath Lab visit)  Clinical Evaluation Leading to the Procedure:   ACS: No.  Non-ACS:    Anginal Classification: CCS II  Anti-ischemic medical therapy: Maximal Therapy (2 or more classes of medications)  Non-Invasive Test Results: High-risk stress test findings: cardiac mortality >3%/year - Echo EF 20-25%  Prior CABG: No previous CABG  AUC FOR R&LHC  Cardiomyopathies (Right and Left Heart Catheterization OR  Right Heart Catheterization Alone With/Without Left Ventriculography and Coronary Angiography)   Patient Information:    Known or suspected cardiomyopathy with or without heart failure  AUC Score:   A (7)   Indication:   93   Jennifer Kaufman W

## 2015-06-25 NOTE — H&P (View-Only) (Signed)
Subjective:  Complains of mild dyspnea today but no chest pain.  Objective:  Vital Signs in the last 24 hours: BP 103/71 mmHg  Pulse 100  Temp(Src) 98.4 F (36.9 C) (Oral)  Resp 18  Ht 5' 1.5" (1.562 m)  Wt 83.235 kg (183 lb 8 oz)  BMI 34.11 kg/m2  SpO2 99%  Physical Exam: Elderly black female in no acute distress Lungs: Mild rales bases  Cardiac:  Regular rhythm, normal S1 and S2, no S3 Abdomen:  Soft, nontender, no masses Extremities:  No edema present  Intake/Output from previous day: 04/08 0701 - 04/09 0700 In: 1224.7 [P.O.:340; I.V.:884.7] Out: 1675 [Urine:1675]  Weight Filed Weights   06/22/15 1542 06/23/15 0357 06/24/15 0500  Weight: 85.639 kg (188 lb 12.8 oz) 84.324 kg (185 lb 14.4 oz) 83.235 kg (183 lb 8 oz)    Lab Results: Basic Metabolic Panel:  Recent Labs  06/23/15 0336 06/24/15 0515  NA 143 140  K 3.3* 3.6  CL 105 103  CO2 26 23  GLUCOSE 140* 134*  BUN 14 13  CREATININE 1.21* 1.17*   CBC:  Recent Labs  06/23/15 0336 06/24/15 0515  WBC 7.4 7.4  HGB 11.0* 10.6*  HCT 31.3* 29.5*  MCV 86.0 86.0  PLT 263 249   Cardiac Enzymes: Troponin (Point of Care Test)  Recent Labs  06/22/15 1052  TROPIPOC 0.15*   Cardiac Panel (last 3 results)  Recent Labs  06/22/15 1904 06/22/15 2226 06/23/15 0336  TROPONINI 0.13* 0.28* 0.24*    Telemetry: Sinus rhythm  Assessment/Plan:  1.  Acute systolic congestive heart failure-echo shows EF of 20-25% with akinesis of the anterior wall.  Troponins are somewhat flat-unclear whether this is ischemic or due to congestive heart failure 2.  Wall motion abnormality on Echo 3.  Hypokalemia somewhat better 4.  Anemia  Recommendations:  Continue intravenous diuresis.  Continue heparin and nitroglycerin.  Cardiac catheterization was discussed with the patient fully including risks of myocardial infarction, death, stroke, bleeding, arrhythmia, dye allergy, renal insufficiency or bleeding.  The patient  understands and is willing to proceed.  Draw blood for anemia workup.   W. Spencer Marcelline Temkin, Jr.  MD [facility]  06/24/2015, 9:37 AM    

## 2015-06-25 NOTE — Progress Notes (Signed)
Site area: right groin a 7 french venous sheath was removed  Site Prior to Removal:  Level 0  Pressure Applied For 15 MINUTES    Minutes Beginning at 15  Manual:   Yes.    Patient Status During Pull:  stable  Post Pull Groin Site:  Level 0  Post Pull Instructions Given:  Yes.    Post Pull Pulses Present:  Yes.    Dressing Applied:  Yes.    Comments:  VS remain stable during sheath pull

## 2015-06-26 ENCOUNTER — Encounter (HOSPITAL_COMMUNITY): Payer: Self-pay | Admitting: Physician Assistant

## 2015-06-26 LAB — CBC
HCT: 32 % — ABNORMAL LOW (ref 36.0–46.0)
HEMOGLOBIN: 11.3 g/dL — AB (ref 12.0–15.0)
MCH: 30.4 pg (ref 26.0–34.0)
MCHC: 35.3 g/dL (ref 30.0–36.0)
MCV: 86 fL (ref 78.0–100.0)
Platelets: 271 10*3/uL (ref 150–400)
RBC: 3.72 MIL/uL — AB (ref 3.87–5.11)
RDW: 17.7 % — ABNORMAL HIGH (ref 11.5–15.5)
WBC: 6 10*3/uL (ref 4.0–10.5)

## 2015-06-26 LAB — BASIC METABOLIC PANEL
ANION GAP: 12 (ref 5–15)
BUN: 16 mg/dL (ref 6–20)
CALCIUM: 9.4 mg/dL (ref 8.9–10.3)
CHLORIDE: 104 mmol/L (ref 101–111)
CO2: 23 mmol/L (ref 22–32)
Creatinine, Ser: 1.21 mg/dL — ABNORMAL HIGH (ref 0.44–1.00)
GFR calc non Af Amer: 44 mL/min — ABNORMAL LOW (ref >60)
GFR, EST AFRICAN AMERICAN: 51 mL/min — AB (ref >60)
Glucose, Bld: 157 mg/dL — ABNORMAL HIGH (ref 65–99)
Potassium: 3.5 mmol/L (ref 3.5–5.1)
Sodium: 139 mmol/L (ref 135–145)

## 2015-06-26 LAB — GLUCOSE, CAPILLARY
Glucose-Capillary: 128 mg/dL — ABNORMAL HIGH (ref 65–99)
Glucose-Capillary: 138 mg/dL — ABNORMAL HIGH (ref 65–99)

## 2015-06-26 MED ORDER — TRAMADOL HCL 50 MG PO TABS
50.0000 mg | ORAL_TABLET | Freq: Once | ORAL | Status: AC
  Administered 2015-06-26: 50 mg via ORAL
  Filled 2015-06-26: qty 1

## 2015-06-26 MED ORDER — FUROSEMIDE 40 MG PO TABS
40.0000 mg | ORAL_TABLET | Freq: Every day | ORAL | Status: DC
  Administered 2015-06-26 – 2015-06-27 (×2): 40 mg via ORAL
  Filled 2015-06-26 (×2): qty 1

## 2015-06-26 MED ORDER — METOPROLOL TARTRATE 25 MG PO TABS
25.0000 mg | ORAL_TABLET | Freq: Two times a day (BID) | ORAL | Status: DC
  Administered 2015-06-26 – 2015-06-27 (×3): 25 mg via ORAL
  Filled 2015-06-26 (×3): qty 1

## 2015-06-26 NOTE — Progress Notes (Signed)
3e13 Pt. Had cath yesterday, R. radial below insertion site have questionable hematoma with small bruise, painful to touch per patient. PA notified, site marked and ice applied per order. Will continue to monitor patient.

## 2015-06-26 NOTE — Progress Notes (Signed)
Patient Name: Georg Ruddle Date of Encounter: 06/26/2015  Primary Cardiologist: new - Dr. Delton See  Pt. Profile:  pleasant 70 year old African-American female with past medical history of HTN, HLD, DM and gout however no past cardiac history presented 06/22/15 with intermittent chest pressure and new onset of heart failure in the last week.   SUBJECTIVE  Breathing improved, however not at baseline. No chest pain or palpitations.   CURRENT MEDS . aspirin EC  81 mg Oral Daily  . atorvastatin  20 mg Oral q1800  . enoxaparin (LOVENOX) injection  40 mg Subcutaneous Q24H  . febuxostat  40 mg Oral Daily  . furosemide  60 mg Intravenous Daily  . irbesartan  300 mg Oral Daily  . metoprolol succinate  12.5 mg Oral Daily  . potassium chloride  40 mEq Oral Daily  . sodium chloride flush  3 mL Intravenous Q12H  . sodium chloride flush  3 mL Intravenous Q12H  . spironolactone  25 mg Oral Daily    OBJECTIVE  Filed Vitals:   06/25/15 1145 06/25/15 1230 06/25/15 2026 06/26/15 0516  BP: 143/84 132/84 119/62 131/76  Pulse:  86 95 91  Temp:   98.6 F (37 C) 97.8 F (36.6 C)  TempSrc:   Oral Oral  Resp: 16  17 17   Height:      Weight:    182 lb 4.8 oz (82.691 kg)  SpO2: 98%  96% 97%    Intake/Output Summary (Last 24 hours) at 06/26/15 0900 Last data filed at 06/26/15 0820  Gross per 24 hour  Intake    960 ml  Output    750 ml  Net    210 ml   Filed Weights   06/24/15 0500 06/25/15 0706 06/26/15 0516  Weight: 183 lb 8 oz (83.235 kg) 181 lb 11.2 oz (82.419 kg) 182 lb 4.8 oz (82.691 kg)    PHYSICAL EXAM  General: Pleasant, NAD. Neuro: Alert and oriented X 3. Moves all extremities spontaneously. Psych: Normal affect. HEENT:  Normal  Neck: Supple without bruits or JVD. Lungs:  Resp regular and unlabored. Diminished breath sound bibasilar with faint rales.  Heart: RRR no s3, s4, or murmurs. Abdomen: Soft, non-tender, non-distended, BS + x 4.  Extremities: No clubbing,  cyanosis. Very trace BL LE edema. DP/PT/Radials 2+ and equal bilaterally. R radial cath site without hematoma.   Accessory Clinical Findings  CBC  Recent Labs  06/25/15 1157 06/26/15 0340  WBC 5.7 6.0  HGB 11.6* 11.3*  HCT 32.9* 32.0*  MCV 86.1 86.0  PLT 290 271   Basic Metabolic Panel  Recent Labs  06/25/15 0325 06/25/15 1157 06/26/15 0340  NA 141  --  139  K 3.6  --  3.5  CL 103  --  104  CO2 25  --  23  GLUCOSE 133*  --  157*  BUN 16  --  16  CREATININE 1.13* 1.17* 1.21*  CALCIUM 9.4  --  9.4    TELE  Sinus rhythm at rate of 90-100s  Echo 06/22/15 LV EF: 20% - 25%  ------------------------------------------------------------------- Indications: CHF - 428.0.  ------------------------------------------------------------------- History: PMH: EKG changes. Risk factors: Hypertension. Diabetes mellitus.  ------------------------------------------------------------------- Study Conclusions  - Left ventricle: The cavity size was normal. Wall thickness was  increased in a pattern of mild LVH. Systolic function was  severely reduced. The estimated ejection fraction was in the  range of 20% to 25%. Diffuse hypokinesis. There is akinesis of  the anteroseptal and apical myocardium.  Doppler parameters are  consistent with restrictive physiology, indicative of decreased  left ventricular diastolic compliance and/or increased left  atrial pressure. - Aortic valve: There was mild regurgitation. - Mitral valve: There was mild regurgitation. - Pericardium, extracardiac: A small pericardial effusion was  identified. There was a left pleural effusion.  Impressions:  - Diffuse hypokinesis with akinesis of the anteroseptal wall and  apex; overall severely reduced LV function; restrictive filling;  mild AI and MR; small pericardial effusion with mild RA collapse.   Right/Left Heart Cath and Coronary Angiography    Conclusion    1. Mid  RCA lesion, 65% stenosed. FFR non-physiologically significant 2. No severe cardiomyopathy, likely nonischemic 3. Elevated LVEDP is consistent with Pulmonary Capillary Wedge Pressure monitoring.  Nonobstructive RCA lesion by FFR Nonischemic Cardiomyopathy.  Plan:  Return to nursing unit after sheath removal.  Continue IV diuresis and optimization of medical management.  Would also treat for noneffective CAD with statin and aspirin.     Radiology/Studies  Dg Chest 2 View  06/22/2015  CLINICAL DATA:  Shortness of Breath EXAM: CHEST  2 VIEW COMPARISON:  08/01/2011 FINDINGS: Cardiomegaly is noted. Central vascular congestion and mild perihilar interstitial prominence highly suspicious for mild pulmonary edema. Probable small bilateral pleural effusion with bilateral basilar atelectasis or infiltrate. Degenerative changes mid and lower thoracic spine. IMPRESSION: Central vascular congestion. Mild perihilar interstitial prominence highly suspicious for pulmonary edema. Question small bilateral pleural effusion. Bilateral basilar atelectasis or infiltrate. Electronically Signed   By: Natasha Mead M.D.   On: 06/22/2015 11:40    ASSESSMENT AND PLAN  1. Acute systolic congestive heart failure-echo shows EF of 20-25% with akinesis of the anterior wall.  - R & L heart cath showed nonobstructive RCA (65%)  lesion by FFR. Nonischemic Cardiomyopathy. Elevated LVEDP.  - Diuresed negative 1.2L with 6lb weight loss (188-->182lb). Scr stable around 1.1-1.2.  - Continue BB, ARB and spironolactone.  I changed her diuretics to oral Lasix today. 2. Elevated troponin - Troponins are somewhat flat. Likely due to acute congestive heart failure  3. Congestive dilated cardiomyopathy (HCC) - AS above  4. Essential hypertension  - Stable and well controlled. Continue current regimen.   5. Hyperlipidemia associated with type 2 diabetes mellitus (HCC) - 06/23/2015: Cholesterol 124; HDL 34*; LDL Cholesterol 67;  Triglycerides 113; VLDL 23  - Continue lipitor   6. Diabetes mellitus type 2 with complications (HCC) - Held home metformin. Hold 48 hours post cath.    Dispo: Close to baseline. Likely discharge in 24-48 hours. Will review with MD.   Lorelei Pont PA-C Pager [phone] Patient seen and examined. I agree with the assessment and plan as detailed above. See also my additional thoughts below.   I had a careful discussion with the patient and her daughter. We discussed limiting both salt and fluid intake. We discussed daily weights to be recorded with the information being brought to the office. The patient is stabilizing. I have increased the beta blocker dose today. There is question of myalgias related to carvedilol in the past. I don't know that this can be documented but we are using metoprolol.  Willa Rough, MD, [facility] 06/26/2015 11:31 AM

## 2015-06-26 NOTE — Discharge Instructions (Signed)
*  Weigh yourself on the same scale at same time of day and keep a log. °*Report weight gain of > 2 lbs in 1 day or 5 lbs over the course of a week and/or symptoms of excess fluid (shortness of breath, difficulty lying flat, swelling, poor appetite, abdominal fullness/bloating, etc) to your doctor immediately. °*Avoid foods that are high in sodium (processed, pre-packaged/canned goods, fast foods, etc). °*Please attend all scheduled and reccommended follow up appointments  °

## 2015-06-26 NOTE — Progress Notes (Signed)
Paged doctor for patient request for something stronger than tylenol for pain. Patient states that in the right upper back over scapula area. Patient describes it as a dull discomfort that comes and goes. Patient does states that she doesn't always say anything but it has been causing her more pain than usual. Doctor gave one time dose for tramadol and will administer per doctor's order and continue to monitor patient to end of shift.

## 2015-06-27 DIAGNOSIS — I5021 Acute systolic (congestive) heart failure: Secondary | ICD-10-CM | POA: Insufficient documentation

## 2015-06-27 LAB — BASIC METABOLIC PANEL
Anion gap: 13 (ref 5–15)
BUN: 18 mg/dL (ref 6–20)
CALCIUM: 9.7 mg/dL (ref 8.9–10.3)
CO2: 24 mmol/L (ref 22–32)
CREATININE: 1.25 mg/dL — AB (ref 0.44–1.00)
Chloride: 104 mmol/L (ref 101–111)
GFR calc non Af Amer: 43 mL/min — ABNORMAL LOW (ref >60)
GFR, EST AFRICAN AMERICAN: 49 mL/min — AB (ref >60)
Glucose, Bld: 128 mg/dL — ABNORMAL HIGH (ref 65–99)
Potassium: 4.1 mmol/L (ref 3.5–5.1)
SODIUM: 141 mmol/L (ref 135–145)

## 2015-06-27 LAB — GLUCOSE, CAPILLARY
Glucose-Capillary: 112 mg/dL — ABNORMAL HIGH (ref 65–99)
Glucose-Capillary: 112 mg/dL — ABNORMAL HIGH (ref 65–99)

## 2015-06-27 MED ORDER — METOPROLOL TARTRATE 25 MG PO TABS: 25.0000 mg | ORAL_TABLET | Freq: Two times a day (BID) | ORAL | Status: DC

## 2015-06-27 MED ORDER — NITROGLYCERIN 0.4 MG SL SUBL: 0.4000 mg | SUBLINGUAL_TABLET | SUBLINGUAL | Status: DC | PRN

## 2015-06-27 MED ORDER — ASPIRIN 81 MG PO TBEC: 81.0000 mg | DELAYED_RELEASE_TABLET | Freq: Every day | ORAL | Status: AC

## 2015-06-27 MED ORDER — FUROSEMIDE 40 MG PO TABS: 40.0000 mg | ORAL_TABLET | Freq: Every day | ORAL | Status: DC

## 2015-06-27 MED ORDER — POTASSIUM CHLORIDE CRYS ER 10 MEQ PO TBCR
10.0000 meq | EXTENDED_RELEASE_TABLET | Freq: Every day | ORAL | Status: DC
Start: 2015-06-27 — End: 2016-01-25

## 2015-06-27 MED ORDER — ATORVASTATIN CALCIUM 20 MG PO TABS: 20.0000 mg | ORAL_TABLET | Freq: Every day | ORAL | Status: DC

## 2015-06-27 MED ORDER — SPIRONOLACTONE 25 MG PO TABS: 25.0000 mg | ORAL_TABLET | Freq: Every day | ORAL | Status: DC

## 2015-06-27 NOTE — Progress Notes (Signed)
Patient Name: Jennifer Kaufman Date of Encounter: 06/27/2015  Primary Cardiologist: new - Dr. Delton See  Pt. Profile:  pleasant 70 year old African-American female with past medical history of HTN, HLD, DM and gout however no past cardiac history presented 06/22/15 with intermittent chest pressure and new onset of heart failure in the last week.   SUBJECTIVE  Feeling well. No chest pain, sob or palpitations. Complains of R sided back pain. No dysuria or polyuria.   CURRENT MEDS . aspirin EC  81 mg Oral Daily  . atorvastatin  20 mg Oral q1800  . enoxaparin (LOVENOX) injection  40 mg Subcutaneous Q24H  . febuxostat  40 mg Oral Daily  . furosemide  40 mg Oral Daily  . irbesartan  300 mg Oral Daily  . metoprolol tartrate  25 mg Oral BID  . potassium chloride  40 mEq Oral Daily  . sodium chloride flush  3 mL Intravenous Q12H  . sodium chloride flush  3 mL Intravenous Q12H  . spironolactone  25 mg Oral Daily    OBJECTIVE  Filed Vitals:   06/26/15 1005 06/26/15 1556 06/26/15 2100 06/27/15 0550  BP: 127/71 103/66 109/72 100/61  Pulse:  84 86   Temp:  98.3 F (36.8 C) 98.3 F (36.8 C) 97.3 F (36.3 C)  TempSrc:  Oral Oral Oral  Resp:  Height:      Weight:    182 lb (82.555 kg)  SpO2:  98% 99% 100%    Intake/Output Summary (Last 24 hours) at 06/27/15 0759 Last data filed at 06/26/15 2243  Gross per 24 hour  Intake    603 ml  Output   1050 ml  Net   -447 ml   Filed Weights   06/25/15 0706 06/26/15 0516 06/27/15 0550  Weight: 181 lb 11.2 oz (82.419 kg) 182 lb 4.8 oz (82.691 kg) 182 lb (82.555 kg)    PHYSICAL EXAM  General: Pleasant, NAD. Neuro: Alert and oriented X 3. Moves all extremities spontaneously. Psych: Normal affect. HEENT:  Normal  Neck: Supple without bruits or JVD. Lungs:  Resp regular and unlabored.CTA.  Heart: RRR no s3, s4, or murmurs. Abdomen: Soft, non-tender, non-distended, BS + x 4.  Extremities: No clubbing, cyanosis or edema.  DP/PT/Radials 2+ and equal bilaterally. R radial cath site without hematoma.  R radial cath site without hematoma, has on small, mobile knot, No bruise. No CVA tenderness. Mild soreness to R sided lowe back.   Accessory Clinical Findings  CBC  Recent Labs  06/25/15 1157 06/26/15 0340  WBC 5.7 6.0  HGB 11.6* 11.3*  HCT 32.9* 32.0*  MCV 86.1 86.0  PLT 290 271   Basic Metabolic Panel  Recent Labs  06/25/15 0325 06/25/15 1157 06/26/15 0340  NA 141  --  139  K 3.6  --  3.5  CL 103  --  104  CO2 25  --  23  GLUCOSE 133*  --  157*  BUN 16  --  16  CREATININE 1.13* 1.17* 1.21*  CALCIUM 9.4  --  9.4    TELE  Sinus rhythm at rate of 90-100s, PVCs. One episode of SVT/sinus tachy last night around 11:79  Echo 06/22/15 LV EF: 20% - 25%  ------------------------------------------------------------------- Indications: CHF - 428.0.  ------------------------------------------------------------------- History: PMH: EKG changes. Risk factors: Hypertension. Diabetes mellitus.  ------------------------------------------------------------------- Study Conclusions  - Left ventricle: The cavity size was normal. Wall thickness was  increased in a pattern of mild LVH. Systolic function was  severely reduced. The estimated ejection fraction was in the  range of 20% to 25%. Diffuse hypokinesis. There is akinesis of  the anteroseptal and apical myocardium. Doppler parameters are  consistent with restrictive physiology, indicative of decreased  left ventricular diastolic compliance and/or increased left  atrial pressure. - Aortic valve: There was mild regurgitation. - Mitral valve: There was mild regurgitation. - Pericardium, extracardiac: A small pericardial effusion was  identified. There was a left pleural effusion.  Impressions:  - Diffuse hypokinesis with akinesis of the anteroseptal wall and  apex; overall severely reduced LV function; restrictive  filling;  mild AI and MR; small pericardial effusion with mild RA collapse.   Right/Left Heart Cath and Coronary Angiography    Conclusion    1. Mid RCA lesion, 65% stenosed. FFR non-physiologically significant 2. No severe cardiomyopathy, likely nonischemic 3. Elevated LVEDP is consistent with Pulmonary Capillary Wedge Pressure monitoring.  Nonobstructive RCA lesion by FFR Nonischemic Cardiomyopathy.  Plan:  Return to nursing unit after sheath removal.  Continue IV diuresis and optimization of medical management.  Would also treat for noneffective CAD with statin and aspirin.     Radiology/Studies  Dg Chest 2 View  06/22/2015  CLINICAL DATA:  Shortness of Breath EXAM: CHEST  2 VIEW COMPARISON:  08/01/2011 FINDINGS: Cardiomegaly is noted. Central vascular congestion and mild perihilar interstitial prominence highly suspicious for mild pulmonary edema. Probable small bilateral pleural effusion with bilateral basilar atelectasis or infiltrate. Degenerative changes mid and lower thoracic spine. IMPRESSION: Central vascular congestion. Mild perihilar interstitial prominence highly suspicious for pulmonary edema. Question small bilateral pleural effusion. Bilateral basilar atelectasis or infiltrate. Electronically Signed   By: Natasha Mead M.D.   On: 06/22/2015 11:40    ASSESSMENT AND PLAN  1. Acute systolic congestive heart failure-echo shows EF of 20-25% with akinesis of the anterior wall.  - R & L heart cath showed nonobstructive RCA (65%)  lesion by FFR. Nonischemic Cardiomyopathy. Elevated LVEDP.  - Diuresed negative 2L with 6lb weight loss (188-->182lb). Scr stable around 1.1-1.2. (penidng BMET today). - Continue BB, ARB, spironolactone and lasix.   2. Elevated troponin - Troponins are somewhat flat. Likely due to acute congestive heart failure.  3. Congestive dilated cardiomyopathy (HCC) - AS above  4. Essential hypertension  - Stable and well controlled. Continue  current regimen.   5. Hyperlipidemia associated with type 2 diabetes mellitus (HCC) - 06/23/2015: Cholesterol 124; HDL 34*; LDL Cholesterol 67; Triglycerides 113; VLDL 23  - Continue lipitor 20mg   6. Diabetes mellitus type 2 with complications (HCC) - Held home metformin. Hold 48 hours post cath.   7. SVT/sinus tachy  - last night around 11:47pm. About 20 beats. Asymptomatic. Continue BB.    Dispo: Likely discharge later today. Pending BMET. MD to review discharge meds.   Lorelei Pont PA-C Pager [phone] Patient seen and examined. I agree with the assessment and plan as detailed above. See also my additional thoughts below.   Renal function is stable. The patient is improved. She will be ready for discharge during the day today. I agree with the complete note as outlined above.  Willa Rough, MD, [facility] 06/27/2015 9:36 AM

## 2015-06-27 NOTE — Discharge Summary (Addendum)
Discharge Summary    Patient ID: Jennifer Kaufman,  MRN: [id number], DOB/AGE: 71-03-1945 71 y.o.  Admit date: 06/22/2015 Discharge date: 06/27/2015  Primary Care Provider: DOUGH,ROBERT Primary Cardiologist: Dr. Delton See   Discharge Diagnoses    Active Problems:   Acute combined systolic and diastolic heart failure (HCC)   Congestive dilated cardiomyopathy (HCC)   Essential hypertension   Hyperlipidemia associated with type 2 diabetes mellitus (HCC)   Diabetes mellitus type 2    Acute systolic CHF (congestive heart failure) (HCC)   Allergies Allergies  Allergen Reactions  . Atorvastatin     Other reaction(s): GI Upset (intolerance) Chest discomfort  . Carvedilol Other (See Comments)    myalgias  . Hydrochlorothiazide Other (See Comments)    hypokalemia  . Lisinopril Cough    Diagnostic Studies/Procedures  Echo 06/22/15 - Left ventricle: The cavity size was normal. Wall thickness was  increased in a pattern of mild LVH. Systolic function was  severely reduced. The estimated ejection fraction was in the  range of 20% to 25%. Diffuse hypokinesis. There is akinesis of  the anteroseptal and apical myocardium. Doppler parameters are  consistent with restrictive physiology, indicative of decreased  left ventricular diastolic compliance and/or increased left  atrial pressure. - Aortic valve: There was mild regurgitation. - Mitral valve: There was mild regurgitation. - Pericardium, extracardiac: A small pericardial effusion was  identified. There was a left pleural effusion.  Impressions:  - Diffuse hypokinesis with akinesis of the anteroseptal wall and  apex; overall severely reduced LV function; restrictive filling;  mild AI and MR; small pericardial effusion with mild RA collapse. _____________    Right/Left Heart Cath and Coronary Angiography 06/25/15  1. Mid RCA lesion, 65% stenosed. FFR non-physiologically significant 2. No severe cardiomyopathy, likely  nonischemic 3. Elevated LVEDP is consistent with Pulmonary Capillary Wedge Pressure monitoring.  Nonobstructive RCA lesion by FFR Nonischemic Cardiomyopathy.  Plan:  Return to nursing unit after sheath removal.  Continue IV diuresis and optimization of medical management.  Would also treat for noneffective CAD with statin and aspirin.  History of Present Illness   [name] is a pleasant 70 year old African-American female with past medical history of HTN, HLD, DM and gout however no past cardiac history.   She presented to the ED on 06/22/15 with complaints of SOB. She lives by herself, and has been able to cook for herself and take care of herself without problem until the beginning of April. She began to notice increasing dyspnea on exertion. She also notices occasional chest tightness. The worst episode was on 06/20/15, where it lasted several hours. For the next 2 days, she was unable to sleep due to significant orthopnea and paroxysmal nocturnal dyspnea. She also complains of shortness of breath even at rest along with lower extremity edema.   She went to her PCP at [facility] and they sent her for further evaluation at the ED.  Significant laboratory finding on arrival include potassium of 3.1, BNP 1001, troponin 0.15. Chest x-ray was concerning for pulmonary edema. EKG showed diffuse T wave inversion.  Hospital Course  Her echo revealed EF 20-25%, mild LVH, diffuse hypokinesis, akinesis of the anteroseptal and apical myocardium.  She went for left and right heart cath on 06/25/15 that showed a 65% stenosed RCA, FFR was not physiologically significant. Her right heart pressures were consistent with moderate pulmonary hypertension. PA mean was 24.  She had elevated LVEDP at 21.   She had a mildly elevated  troponin that remained flat likely due to the CHF. She was diuresed 2L and had a 6 pound weight loss.  Her Scr remained stable around 1.1-1.2.  She has been educated about  limiting salt and fluid in her diet.  She will keep track of her daily weights and bring them to her next appointment.    She had one episode of sinus arrhythmia of about 20 beats, she is on beta blocker.  She is allergic to lisinopril, she is on ARB, BB, furosemide and spironolactone.  She was seen today by Dr. Myrtis Ser who deemed her suitable for discharge.  _____________  Discharge Vitals Blood pressure 100/61, pulse 86, temperature 97.3 F (36.3 C), temperature source Oral, resp. rate 18, height 5' 1.5" (1.562 m), weight 182 lb (82.555 kg), SpO2 100 %.  Filed Weights   06/25/15 0706 06/26/15 0516 06/27/15 0550  Weight: 181 lb 11.2 oz (82.419 kg) 182 lb 4.8 oz (82.691 kg) 182 lb (82.555 kg)    Labs & Radiologic Studies     CBC  Recent Labs  06/25/15 1157 06/26/15 0340  WBC 5.7 6.0  HGB 11.6* 11.3*  HCT 32.9* 32.0*  MCV 86.1 86.0  PLT 290 271   Basic Metabolic Panel  Recent Labs  06/26/15 0340 06/27/15 0607  NA 139 141  K 3.5 4.1  CL 104 104  CO2 23 24  GLUCOSE 157* 128*  BUN 16 18  CREATININE 1.21* 1.25*  CALCIUM 9.4 9.7    Dg Chest 2 View  06/22/2015  CLINICAL DATA:  Shortness of Breath EXAM: CHEST  2 VIEW COMPARISON:  08/01/2011 FINDINGS: Cardiomegaly is noted. Central vascular congestion and mild perihilar interstitial prominence highly suspicious for mild pulmonary edema. Probable small bilateral pleural effusion with bilateral basilar atelectasis or infiltrate. Degenerative changes mid and lower thoracic spine. IMPRESSION: Central vascular congestion. Mild perihilar interstitial prominence highly suspicious for pulmonary edema. Question small bilateral pleural effusion. Bilateral basilar atelectasis or infiltrate. Electronically Signed   By: Natasha Mead M.D.   On: 06/22/2015 11:40    Disposition   Pt is being discharged home today in good condition.  Follow-up Plans & Appointments    Follow-up Information    Follow up with DOUGH,ROBERT, MD.   Specialty:   Family Medicine   Contact information:   [address] [place] [address] [phone]       Follow up with Jacolyn Reedy, PA-C On 07/11/2015.   Specialty:  Cardiology   Why:  at 10:30am     Contact information:   [address] [place] [address] [phone]      Discharge Instructions    Diet - low sodium heart healthy    Complete by:  As directed      Increase activity slowly    Complete by:  As directed            Discharge Medications   Current Discharge Medication List    START taking these medications   Details  aspirin EC 81 MG EC tablet Take 1 tablet (81 mg total) by mouth daily.    furosemide (LASIX) 40 MG tablet Take 1 tablet (40 mg total) by mouth daily. Qty: 30 tablet, Refills: 6    metoprolol tartrate (LOPRESSOR) 25 MG tablet Take 1 tablet (25 mg total) by mouth 2 (two) times daily. Qty: 60 tablet, Refills: 6    nitroGLYCERIN (NITROSTAT) 0.4 MG SL tablet Place 1 tablet (0.4 mg total) under the tongue every 5 (five) minutes  as needed for chest pain. Qty: 25 tablet, Refills: 2    potassium chloride SA (K-DUR,KLOR-CON) 10 MEQ tablet Take 1 tablet (10 mEq total) by mouth daily. Qty: 30 tablet, Refills: 6    spironolactone (ALDACTONE) 25 MG tablet Take 1 tablet (25 mg total) by mouth daily. Qty: 30 tablet, Refills: 6      CONTINUE these medications which have CHANGED   Details  atorvastatin (LIPITOR) 20 MG tablet Take 1 tablet (20 mg total) by mouth daily at 6 PM. Qty: 30 tablet, Refills: 6      CONTINUE these medications which have NOT CHANGED   Details  colchicine (COLCRYS) 0.6 MG tablet Take 0.6-1.2 mg by mouth daily as needed (1.2 mg at gout onset, 0.6 mg 1 hour later).     metFORMIN (GLUCOPHAGE) 1000 MG tablet Take 1,000 mg by mouth 2 (two) times daily with a meal.     ULORIC 40 MG tablet Take 40 mg by mouth daily.  Refills: 11    valsartan (DIOVAN) 320 MG tablet Take 320 mg by mouth daily.           Outstanding  Labs/Studies  BMP during post hospital appointment.   Duration of Discharge Encounter   Greater than 30 minutes including physician time.  Signed, Little Ishikawa NP 06/27/2015, 11:48 AM  Patient seen and examined. I agree with the assessment and plan as detailed above. See also my additional thoughts below.   See my progress note from today also. I made the decision for discharge. Agree with note and plan above.  Willa Rough, MD, [facility] 06/28/2015 8:52 AM

## 2015-07-11 ENCOUNTER — Encounter: Payer: Self-pay | Admitting: Cardiology

## 2015-07-11 ENCOUNTER — Ambulatory Visit (INDEPENDENT_AMBULATORY_CARE_PROVIDER_SITE_OTHER): Payer: Medicare Other | Admitting: Cardiology

## 2015-07-11 VITALS — BP 102/64 | HR 62 | Ht 62.0 in | Wt 182.0 lb

## 2015-07-11 DIAGNOSIS — I5041 Acute combined systolic (congestive) and diastolic (congestive) heart failure: Secondary | ICD-10-CM | POA: Diagnosis not present

## 2015-07-11 DIAGNOSIS — I428 Other cardiomyopathies: Secondary | ICD-10-CM

## 2015-07-11 DIAGNOSIS — I429 Cardiomyopathy, unspecified: Secondary | ICD-10-CM | POA: Diagnosis not present

## 2015-07-11 DIAGNOSIS — I10 Essential (primary) hypertension: Secondary | ICD-10-CM | POA: Diagnosis not present

## 2015-07-11 DIAGNOSIS — I519 Heart disease, unspecified: Secondary | ICD-10-CM

## 2015-07-11 DIAGNOSIS — I2510 Atherosclerotic heart disease of native coronary artery without angina pectoris: Secondary | ICD-10-CM

## 2015-07-11 LAB — BASIC METABOLIC PANEL
BUN: 30 mg/dL — ABNORMAL HIGH (ref 7–25)
CALCIUM: 10.2 mg/dL (ref 8.6–10.4)
CHLORIDE: 102 mmol/L (ref 98–110)
CO2: 22 mmol/L (ref 20–31)
CREATININE: 1.63 mg/dL — AB (ref 0.60–0.93)
Glucose, Bld: 114 mg/dL — ABNORMAL HIGH (ref 65–99)
Potassium: 5.2 mmol/L (ref 3.5–5.3)
SODIUM: 137 mmol/L (ref 135–146)

## 2015-07-11 LAB — MAGNESIUM: Magnesium: 1.7 mg/dL (ref 1.5–2.5)

## 2015-07-11 NOTE — Progress Notes (Signed)
Cardiology Office Note   Date:  07/11/2015   ID:  Jennifer Kaufman, Jennifer Kaufman 27-Aug-1945, MRN [id number]  PCP:  Dina Rich, MD  Cardiologist:  Dr. Delton See    Chief Complaint  Patient presents with  . Hospitalization Follow-up      History of Present Illness: Jennifer Kaufman is a 70 y.o. female who presents for post hospitalization for acute systolic and diastolic HF with EF 20-25%. Admit 06/22/15    D/C'd4/12/17   Past medical history of HTN, HLD, DM and gout however no past cardiac history prior to this admit.  She was diuresed 2 L  -allergic to lisinopril on ARB.  Not much change in wt.  Cr 1.25.    TODAY she feels much better.  No chest pain, no SOB.  occ lightheadedness.  She is weighing daily and home wt and office wt stable.  She has decreased salt intake.    Past Medical History  Diagnosis Date  . Hypertension   . Gout   . High cholesterol   . CHF (congestive heart failure) (HCC) dx'd 06/22/2015  . Type II diabetes mellitus (HCC)   . Anemia   . History of blood transfusion     'w/hysterectomy"  . Arthritis     "all over"  . Nonischemic cardiomyopathy (HCC)     a. echo 06/22/15 EF of 20-25% with akinesis of the anterior wall b. R & L cath 06/25/15 -nonobstructive RCA (65%) lesion by FFR, Elevated LVEDP --> medical managment     Past Surgical History  Procedure Laterality Date  . Total hip arthroplasty Left 2000  . Joint replacement    . Carpal tunnel release Bilateral   . Meniscus repair Left     "scoped"  . Abdominal hysterectomy    . Tubal ligation    . Thyroid lobectomy    . Cardiac catheterization N/A 06/25/2015    Procedure: Right/Left Heart Cath and Coronary Angiography;  Surgeon: Marykay Lex, MD;  Location: [facility] INVASIVE CV LAB;  Service: Cardiovascular;  Laterality: N/A;     Current Outpatient Prescriptions  Medication Sig Dispense Refill  . aspirin EC 81 MG EC tablet Take 1 tablet (81 mg total) by mouth daily.    Marland Kitchen atorvastatin (LIPITOR) 20 MG tablet Take  1 tablet (20 mg total) by mouth daily at 6 PM. 30 tablet 6  . colchicine (COLCRYS) 0.6 MG tablet Take 0.6-1.2 mg by mouth daily as needed (1.2 mg at gout onset, 0.6 mg 1 hour later).     . furosemide (LASIX) 40 MG tablet Take 1 tablet (40 mg total) by mouth daily. 30 tablet 6  . metFORMIN (GLUCOPHAGE) 1000 MG tablet Take 1,000 mg by mouth 2 (two) times daily with a meal.     . metoprolol tartrate (LOPRESSOR) 25 MG tablet Take 1 tablet (25 mg total) by mouth 2 (two) times daily. 60 tablet 6  . nitroGLYCERIN (NITROSTAT) 0.4 MG SL tablet Place 1 tablet (0.4 mg total) under the tongue every 5 (five) minutes as needed for chest pain. 25 tablet 2  . potassium chloride SA (K-DUR,KLOR-CON) 10 MEQ tablet Take 1 tablet (10 mEq total) by mouth daily. 30 tablet 6  . spironolactone (ALDACTONE) 25 MG tablet Take 1 tablet (25 mg total) by mouth daily. 30 tablet 6  . ULORIC 40 MG tablet Take 40 mg by mouth daily.   11  . valsartan (DIOVAN) 320 MG tablet Take 320 mg by mouth daily.      No current  facility-administered medications for this visit.    Allergies:   Atorvastatin; Carvedilol; Hydrochlorothiazide; and Lisinopril    Social History:  The patient  reports that she has never smoked. Her smokeless tobacco use includes Snuff. She reports that she does not drink alcohol or use illicit drugs.   Family History:  The patient's family history includes Hypertension in her mother.    ROS:  General:no colds or fevers, no weight changes Skin:no rashes or ulcers HEENT:no blurred vision, no congestion CV:see HPI PUL:see HPI GI:no diarrhea constipation or melena, no indigestion GU:no hematuria, no dysuria MS:no joint pain, no claudication Neuro:no syncope, no lightheadedness Endo:+ diabetes, no thyroid disease  Wt Readings from Last 3 Encounters:  07/11/15 182 lb (82.555 kg)  06/27/15 182 lb (82.555 kg)     PHYSICAL EXAM: VS:  BP 102/64 mmHg  Pulse 62  Ht 5\' 2"  (1.575 m)  Wt 182 lb (82.555 kg)   BMI 33.28 kg/m2 , BMI Body mass index is 33.28 kg/(m^2). General:Pleasant affect, NAD Skin:Warm and dry, brisk capillary refill HEENT:normocephalic, sclera clear, mucus membranes moist Neck:supple, no JVD, no bruits  Heart:S1S2 RRR without murmur, gallup, rub or click Lungs:clear without rales, rhonchi, or wheezes ZOX:WRUE, non tender, + BS, do not palpate liver spleen or masses Ext:no lower ext edema, 2+ pedal pulses, 2+ radial pulses Neuro:alert and oriented X 3, MAE, follows commands, + facial symmetry    EKG:  EKG is ordered today. The ekg ordered today demonstrates SR with LAD and LVH no acute changes from the hospital.  .     Recent Labs: 06/22/2015: B Natriuretic Peptide 1001.5* 06/26/2015: Hemoglobin 11.3*; Platelets 271 06/27/2015: BUN 18; Creatinine, Ser 1.25*; Potassium 4.1; Sodium 141    Lipid Panel    Component Value Date/Time   CHOL 124 06/23/2015 0336   TRIG 113 06/23/2015 0336   HDL 34* 06/23/2015 0336   CHOLHDL 3.6 06/23/2015 0336   VLDL 23 06/23/2015 0336   LDLCALC 67 06/23/2015 0336       Other studies Reviewed: Additional studies/ records that were reviewed today include: . Echo 06/22/15 - Left ventricle: The cavity size was normal. Wall thickness was  increased in a pattern of mild LVH. Systolic function was  severely reduced. The estimated ejection fraction was in the  range of 20% to 25%. Diffuse hypokinesis. There is akinesis of  the anteroseptal and apical myocardium. Doppler parameters are  consistent with restrictive physiology, indicative of decreased  left ventricular diastolic compliance and/or increased left  atrial pressure. - Aortic valve: There was mild regurgitation. - Mitral valve: There was mild regurgitation. - Pericardium, extracardiac: A small pericardial effusion was  identified. There was a left pleural effusion.  Impressions:  - Diffuse hypokinesis with akinesis of the anteroseptal wall and  apex; overall severely  reduced LV function; restrictive filling;  mild AI and MR; small pericardial effusion with mild RA collapse. _____________    Right/Left Heart Cath and Coronary Angiography 06/25/15 1.  Mid RCA lesion, 65% stenosed. FFR non-physiologically significant 2. No severe cardiomyopathy, likely nonischemic 3. Elevated LVEDP is consistent with Pulmonary Capillary Wedge Pressure monitoring.  Nonobstructive RCA lesion by FFR Nonischemic Cardiomyopathy.  Plan:  Return to nursing unit after sheath removal.  Continue IV diuresis and optimization of medical management.  Would also treat for noneffective CAD with statin and aspirin.        ASSESSMENT AND PLAN:  1.  Acute combined systolic and diastolic heart failure (HCC)  Resolved, wt stable will check BMP  today. All questions answered.  She will follow up with Dr. Delton See in 6-8 weeks.  She will call if wt gain SOB or increased lightheadedness.  2. Congestive dilated cardiomyopathy (HCC) new on ARB, BB, diuretic, and spironolactone. Repeat echo in 3 months.  Unable to increase BB today due to borderline HTN and occ lightheadedness. If no improvement with EF pt may need ICD.   3. Essential hypertension controlled.  4. Hyperlipidemia associated with type 2 diabetes mellitus (HCC) on lipitor at 20 mg.  LDL at 67  5   Diabetes mellitus type 2 followed by PCP  6. Non obstructive CAD.    Current medicines are reviewed with the patient today.  The patient Has no concerns regarding medicines.  The following changes have been made:  See above Labs/ tests ordered today include:see above  Disposition:   FU:  see above  Signed, Leone Brand, NP  07/11/2015 11:05 AM    [facility] [address] [place], [place], [place]  [address]/ [address] [place], [place] Phone: [phone]; Fax: [phone]  [phone]

## 2015-07-11 NOTE — Patient Instructions (Addendum)
Medication Instructions:  Your physician recommends that you continue on your current medications as directed. Please refer to the Current Medication list given to you today.   Labwork: TODAY:  BMET, MAGNESIUM  Testing/Procedures: Your physician has requested that you have an echocardiogram IN 3 MONTHS  Echocardiography is a painless test that uses sound waves to create images of your heart. It provides your doctor with information about the size and shape of your heart and how well your heart's chambers and valves are working. This procedure takes approximately one hour. There are no restrictions for this procedure.   Follow-Up: Your physician recommends that you schedule a follow-up appointment in:  6-8 WEEKS WITH DR Delton See   Any Other Special Instructions Will Be Listed Below (If Applicable).  Echocardiogram An echocardiogram, or echocardiography, uses sound waves (ultrasound) to produce an image of your heart. The echocardiogram is simple, painless, obtained within a short period of time, and offers valuable information to your health care provider. The images from an echocardiogram can provide information such as:  Evidence of coronary artery disease (CAD).  Heart size.  Heart muscle function.  Heart valve function.  Aneurysm detection.  Evidence of a past heart attack.  Fluid buildup around the heart.  Heart muscle thickening.  Assess heart valve function. LET [facility] CARE PROVIDER KNOW ABOUT:  Any allergies you have.  All medicines you are taking, including vitamins, herbs, eye drops, creams, and over-the-counter medicines.  Previous problems you or members of your family have had with the use of anesthetics.  Any blood disorders you have.  Previous surgeries you have had.  Medical conditions you have.  Possibility of pregnancy, if this applies. BEFORE THE PROCEDURE  No special preparation is needed. Eat and drink normally.  PROCEDURE   In order to  produce an image of your heart, gel will be applied to your chest and a wand-like tool (transducer) will be moved over your chest. The gel will help transmit the sound waves from the transducer. The sound waves will harmlessly bounce off your heart to allow the heart images to be captured in real-time motion. These images will then be recorded.  You may need an IV to receive a medicine that improves the quality of the pictures. AFTER THE PROCEDURE You may return to your normal schedule including diet, activities, and medicines, unless your health care provider tells you otherwise.   This information is not intended to replace advice given to you by your health care provider. Make sure you discuss any questions you have with your health care provider.   Document Released: 02/29/2000 Document Revised: 03/24/2014 Document Reviewed: 11/08/2012 Elsevier Interactive Patient Education [employer]    If you need a refill on your cardiac medications before your next appointment, please call your pharmacy.

## 2015-07-12 ENCOUNTER — Telehealth: Payer: Self-pay | Admitting: *Deleted

## 2015-07-12 DIAGNOSIS — I10 Essential (primary) hypertension: Secondary | ICD-10-CM

## 2015-07-12 MED ORDER — SPIRONOLACTONE 25 MG PO TABS: 12.5000 mg | ORAL_TABLET | Freq: Every day | ORAL | Status: DC

## 2015-07-12 NOTE — Telephone Encounter (Signed)
Pt aware of her lab results and to decrease spironalactone 25 mg taking 1/2 tablet daily (medicaiton was spelled for pt so she would know which one to cut back) Pt will come in 07/19/15 for repeat bmet Orders in epic Pt verbalized understanding.

## 2015-07-19 ENCOUNTER — Other Ambulatory Visit (INDEPENDENT_AMBULATORY_CARE_PROVIDER_SITE_OTHER): Payer: Medicare Other | Admitting: *Deleted

## 2015-07-19 ENCOUNTER — Telehealth: Payer: Self-pay | Admitting: Cardiology

## 2015-07-19 DIAGNOSIS — I10 Essential (primary) hypertension: Secondary | ICD-10-CM | POA: Diagnosis not present

## 2015-07-19 LAB — BASIC METABOLIC PANEL
BUN: 30 mg/dL — ABNORMAL HIGH (ref 7–25)
CALCIUM: 9.7 mg/dL (ref 8.6–10.4)
CO2: 25 mmol/L (ref 20–31)
Chloride: 105 mmol/L (ref 98–110)
Creat: 1.53 mg/dL — ABNORMAL HIGH (ref 0.60–0.93)
Glucose, Bld: 125 mg/dL — ABNORMAL HIGH (ref 65–99)
Potassium: 4.6 mmol/L (ref 3.5–5.3)
SODIUM: 141 mmol/L (ref 135–146)

## 2015-07-19 NOTE — Telephone Encounter (Signed)
New message   Pt daughter is calling to give updates to RN  Pt is complaining of being hot flashes and chills,jittery, lightheaded, with joint pain  Asked daughter if pt is having any SOB,CP,SWELLING,Any MEDICATION NEEDS she replied no

## 2015-07-19 NOTE — Telephone Encounter (Signed)
Pt and daughter are calling to inform Dr Delton See that the pt saw Nada Boozer on 07/11/15 and was instructed to call the office back if she starts experiencing light-headedness.  Per both parties, the pt has had episodes of feeling light-headed, having hot flashes, chills, jittery, fatigued, joint pain, and feels "feverish." Both the pt and daughter state that she has no chest pain, LEE, sob, pre-syncopal or syncopal episodes.  Pt states she has no cardiac complaints at all Daughter states that the pts appetite is not what it use to be.  Daughter states that she monitors the pts BP and HR and its been 130s/70s and HR 70s.  Daughter states the pt is diabetic, and is followed by her PCP, and the pt takes her BS before meals, and its been running around 98 and then she eats and takes her Metformin 1000 mg po bid.  Daughter wants to run this by Dr Delton See for further recommendations if any. Pt had labs done today, as ordered by Vernona Rieger at the pts 4/26 OV.  Pt and daughter state that they will also be following up with her PCP in regards to complaints mentioned, for she may need further work-up.  Informed both the pt and the daughter that I will route this message to Dr Delton See for further review and recommendation, and follow-up with the pt thereafter.  Pt and daughter verbalized understanding and agrees with this plan.

## 2015-07-20 NOTE — Telephone Encounter (Signed)
Notified the pt that per Dr Delton See, she recommends that we hold her atorvastatin and she should call her PCP today to schedule a follow-up appt with them, to see if this can be acute viral illness--with fever, fatigue, and muscle pain.  Informed the pt that Dr Delton See would like for her to call us back in the next week to report how she is feeling.  Informed the pt that I will place a hold on her atorvastatin in her med list.  Advised her to call her PCP now.  Pt verbalized understanding, agrees with this plan, and gracious for all the assistance provided.

## 2015-07-20 NOTE — Telephone Encounter (Signed)
I would hold atorvastatin and follow with PCP to see if this can be acute viral illness - with fever, fatigue, muscle pain. She should call us the next week to see how she is doing.

## 2015-08-21 DIAGNOSIS — E782 Mixed hyperlipidemia: Secondary | ICD-10-CM | POA: Diagnosis not present

## 2015-09-05 ENCOUNTER — Encounter: Payer: Self-pay | Admitting: Cardiology

## 2015-09-05 ENCOUNTER — Ambulatory Visit (INDEPENDENT_AMBULATORY_CARE_PROVIDER_SITE_OTHER): Payer: Medicare Other | Admitting: Cardiology

## 2015-09-05 VITALS — BP 112/62 | HR 64 | Ht 62.0 in | Wt 181.0 lb

## 2015-09-05 DIAGNOSIS — I2510 Atherosclerotic heart disease of native coronary artery without angina pectoris: Secondary | ICD-10-CM | POA: Diagnosis not present

## 2015-09-05 DIAGNOSIS — I428 Other cardiomyopathies: Secondary | ICD-10-CM

## 2015-09-05 DIAGNOSIS — Z889 Allergy status to unspecified drugs, medicaments and biological substances status: Secondary | ICD-10-CM

## 2015-09-05 DIAGNOSIS — E1169 Type 2 diabetes mellitus with other specified complication: Secondary | ICD-10-CM | POA: Diagnosis not present

## 2015-09-05 DIAGNOSIS — I5041 Acute combined systolic (congestive) and diastolic (congestive) heart failure: Secondary | ICD-10-CM

## 2015-09-05 DIAGNOSIS — Z789 Other specified health status: Secondary | ICD-10-CM

## 2015-09-05 DIAGNOSIS — I420 Dilated cardiomyopathy: Secondary | ICD-10-CM

## 2015-09-05 DIAGNOSIS — I429 Cardiomyopathy, unspecified: Secondary | ICD-10-CM

## 2015-09-05 DIAGNOSIS — E785 Hyperlipidemia, unspecified: Secondary | ICD-10-CM

## 2015-09-05 DIAGNOSIS — I10 Essential (primary) hypertension: Secondary | ICD-10-CM | POA: Diagnosis not present

## 2015-09-05 LAB — COMPREHENSIVE METABOLIC PANEL
ALT: 6 U/L (ref 6–29)
AST: 13 U/L (ref 10–35)
Albumin: 4.5 g/dL (ref 3.6–5.1)
Alkaline Phosphatase: 87 U/L (ref 33–130)
BUN: 38 mg/dL — ABNORMAL HIGH (ref 7–25)
CO2: 24 mmol/L (ref 20–31)
Calcium: 9.8 mg/dL (ref 8.6–10.4)
Chloride: 104 mmol/L (ref 98–110)
Creat: 1.64 mg/dL — ABNORMAL HIGH (ref 0.60–0.93)
Glucose, Bld: 117 mg/dL — ABNORMAL HIGH (ref 65–99)
Potassium: 4.5 mmol/L (ref 3.5–5.3)
Sodium: 138 mmol/L (ref 135–146)
Total Bilirubin: 0.6 mg/dL (ref 0.2–1.2)
Total Protein: 6.9 g/dL (ref 6.1–8.1)

## 2015-09-05 MED ORDER — PRAVASTATIN SODIUM 20 MG PO TABS: 20.0000 mg | ORAL_TABLET | Freq: Every evening | ORAL | Status: DC

## 2015-09-05 NOTE — Patient Instructions (Signed)
Medication Instructions:   STOP TAKING ATORVASTATIN NOW  START TAKING PRAVASTATIN 20 MG ONCE DAILY    Labwork:  TODAY-CMET    Follow-Up:  3 MONTHS WITH DR Delton See       If you need a refill on your cardiac medications before your next appointment, please call your pharmacy.

## 2015-09-05 NOTE — Progress Notes (Signed)
Cardiology Office Note   Date:  09/05/2015   ID:  Jennifer, Kaufman 03-03-1946, MRN [id number]  PCP:  Jennifer Rich, MD  Cardiologist:  Dr. Delton See    Chief complain: 66 follow-up, muscle pain.   History of Present Illness: Jennifer Kaufman is a 70 y.o. female who presents for post hospitalization for acute systolic and diastolic HF with EF 20-25%. Admit 06/22/15    D/C'd4/12/17   Past medical history of HTN, HLD, DM and gout however no past cardiac history prior to this admit.  She was diuresed 2 L  -allergic to lisinopril on ARB.  Not much change in wt.  Cr 1.25.    09/05/2015, today she complains of generalized joint and muscle pain after starting atorvastatin and she discontinued however her primary care physician instructed her to restart again. She says that her weight has been stable she has been tolerating her medications well and overall she feels much better since she was discharged from the hospital. She denies any orthopnea paroxysmal nocturnal dyspnea no palpitations or syncope. She has been compliant to her medicines.   Past Medical History  Diagnosis Date  . Hypertension   . Gout   . High cholesterol   . CHF (congestive heart failure) (HCC) dx'd 06/22/2015  . Type II diabetes mellitus (HCC)   . Anemia   . History of blood transfusion     'w/hysterectomy"  . Arthritis     "all over"  . Nonischemic cardiomyopathy (HCC)     a. echo 06/22/15 EF of 20-25% with akinesis of the anterior wall b. R & L cath 06/25/15 -nonobstructive RCA (65%) lesion by FFR, Elevated LVEDP --> medical managment     Past Surgical History  Procedure Laterality Date  . Total hip arthroplasty Left 2000  . Joint replacement    . Carpal tunnel release Bilateral   . Meniscus repair Left     "scoped"  . Abdominal hysterectomy    . Tubal ligation    . Thyroid lobectomy    . Cardiac catheterization N/A 06/25/2015    Procedure: Right/Left Heart Cath and Coronary Angiography;  Surgeon: Marykay Lex,  MD;  Location: [facility] INVASIVE CV LAB;  Service: Cardiovascular;  Laterality: N/A;     Current Outpatient Prescriptions  Medication Sig Dispense Refill  . aspirin EC 81 MG EC tablet Take 1 tablet (81 mg total) by mouth daily.    Marland Kitchen atorvastatin (LIPITOR) 20 MG tablet Take 1 tablet (20 mg total) by mouth daily at 6 PM. 30 tablet 6  . colchicine (COLCRYS) 0.6 MG tablet Take 0.6-1.2 mg by mouth daily as needed (1.2 mg at gout onset, 0.6 mg 1 hour later).     . furosemide (LASIX) 40 MG tablet Take 1 tablet (40 mg total) by mouth daily. 30 tablet 6  . metFORMIN (GLUCOPHAGE) 1000 MG tablet Take 1,000 mg by mouth 2 (two) times daily with a meal.     . metoprolol tartrate (LOPRESSOR) 25 MG tablet Take 1 tablet (25 mg total) by mouth 2 (two) times daily. 60 tablet 6  . nitroGLYCERIN (NITROSTAT) 0.4 MG SL tablet Place 1 tablet (0.4 mg total) under the tongue every 5 (five) minutes as needed for chest pain. 25 tablet 2  . potassium chloride SA (K-DUR,KLOR-CON) 10 MEQ tablet Take 1 tablet (10 mEq total) by mouth daily. 30 tablet 6  . spironolactone (ALDACTONE) 25 MG tablet Take 0.5 tablets (12.5 mg total) by mouth daily. 30 tablet 6  . Thyra Breed  40 MG tablet Take 40 mg by mouth daily.   11  . valsartan (DIOVAN) 320 MG tablet Take 320 mg by mouth daily.      No current facility-administered medications for this visit.    Allergies:   Atorvastatin; Carvedilol; Hydrochlorothiazide; and Lisinopril    Social History:  The patient  reports that she has never smoked. Her smokeless tobacco use includes Snuff. She reports that she does not drink alcohol or use illicit drugs.   Family History:  The patient's family history includes Hypertension in her mother.    ROS:  General:no colds or fevers, no weight changes Skin:no rashes or ulcers HEENT:no blurred vision, no congestion CV:see HPI PUL:see HPI GI:no diarrhea constipation or melena, no indigestion GU:no hematuria, no dysuria MS:no joint pain, no  claudication Neuro:no syncope, no lightheadedness Endo:+ diabetes, no thyroid disease  Wt Readings from Last 3 Encounters:  09/05/15 181 lb (82.101 kg)  07/11/15 182 lb (82.555 kg)  06/27/15 182 lb (82.555 kg)     PHYSICAL EXAM: VS:  BP 112/62 mmHg  Pulse 64  Ht  (1.575 m)  Wt 181 lb (82.101 kg)  BMI 33.10 kg/m2 , BMI Body mass index is 33.1 kg/(m^2). General:Pleasant affect, NAD Skin:Warm and dry, brisk capillary refill HEENT:normocephalic, sclera clear, mucus membranes moist Neck:supple, no JVD, no bruits  Heart:S1S2 RRR without murmur, gallup, rub or click Lungs:clear without rales, rhonchi, or wheezes JXB:JYNW, non tender, + BS, do not palpate liver spleen or masses Ext:no lower ext edema, 2+ pedal pulses, 2+ radial pulses Neuro:alert and oriented X 3, MAE, follows commands, + facial symmetry  EKG:  EKG is ordered today. The ekg ordered today demonstrates SR with LAD and LVH no acute changes from the hospital.  .    Recent Labs: 06/22/2015: B Natriuretic Peptide 1001.5* 06/26/2015: Hemoglobin 11.3*; Platelets 271 07/11/2015: Magnesium 1.7 07/19/2015: BUN 30*; Creat 1.53*; Potassium 4.6; Sodium 141   Lipid Panel    Component Value Date/Time   CHOL 124 06/23/2015 0336   TRIG 113 06/23/2015 0336   HDL 34* 06/23/2015 0336   CHOLHDL 3.6 06/23/2015 0336   VLDL 23 06/23/2015 0336   LDLCALC 67 06/23/2015 0336    Other studies Reviewed: Additional studies/ records that were reviewed today include: . Echo 06/22/15 - Left ventricle: The cavity size was normal. Wall thickness was  increased in a pattern of mild LVH. Systolic function was  severely reduced. The estimated ejection fraction was in the  range of 20% to 25%. Diffuse hypokinesis. There is akinesis of  the anteroseptal and apical myocardium. Doppler parameters are  consistent with restrictive physiology, indicative of decreased  left ventricular diastolic compliance and/or increased left  atrial  pressure. - Aortic valve: There was mild regurgitation. - Mitral valve: There was mild regurgitation. - Pericardium, extracardiac: A small pericardial effusion was  identified. There was a left pleural effusion.  Impressions:  - Diffuse hypokinesis with akinesis of the anteroseptal wall and  apex; overall severely reduced LV function; restrictive filling;  mild AI and MR; small pericardial effusion with mild RA collapse. _____________    Right/Left Heart Cath and Coronary Angiography 06/25/15 1.  Mid RCA lesion, 65% stenosed. FFR non-physiologically significant 2. No severe cardiomyopathy, likely nonischemic 3. Elevated LVEDP is consistent with Pulmonary Capillary Wedge Pressure monitoring.  Nonobstructive RCA lesion by FFR Nonischemic Cardiomyopathy.  Plan:  Return to nursing unit after sheath removal.  Continue IV diuresis and optimization of medical management.  Would also treat for noneffective  CAD with statin and aspirin.        ASSESSMENT AND PLAN:  1.  Acute combined systolic and diastolic heart failure (HCC)  Resolved, wt stable will check BMP today.  She has been on optimal medical therapy, we will check her BMP today to recheck potassium and creatinine, she is euvolemic we'll continue the same management.  2. Congestive dilated cardiomyopathy (HCC) new on ARB, BB, diuretic, and spironolactone. She is scheduled for repeat echocardiogram in July 2017 date will be after 3 months of optimal medical therapy. If she continues to have LVEF less than 35% she'll be referred for an ICD implantation. Her QRS is narrow social and qualify for biventricular pacing.   3. Essential hypertension - controlled.  4. Hyperlipidemia associated with type 2 diabetes mellitus (HCC) on lipitor at 20 mg with significant side effects to watch H atorvastatin 2 pravastatin 20 mg daily.  5. Diabetes mellitus type 2 followed by PCP  6. Non obstructive CAD. On aspirin, metoprolol,  valsartan and atorvastatin.   Follow-up in 6 weeks.  Signed, Tobias Alexander, MD  09/05/2015 10:46 AM    [facility] [address] [place], [place], [place]  [address]/ [address] [place], [place] Phone: [phone]; Fax: [phone]  [phone]

## 2015-09-06 ENCOUNTER — Telehealth: Payer: Self-pay | Admitting: *Deleted

## 2015-09-06 DIAGNOSIS — R799 Abnormal finding of blood chemistry, unspecified: Secondary | ICD-10-CM | POA: Insufficient documentation

## 2015-09-06 DIAGNOSIS — R7989 Other specified abnormal findings of blood chemistry: Secondary | ICD-10-CM

## 2015-09-06 DIAGNOSIS — I5041 Acute combined systolic (congestive) and diastolic (congestive) heart failure: Secondary | ICD-10-CM

## 2015-09-06 DIAGNOSIS — I5021 Acute systolic (congestive) heart failure: Secondary | ICD-10-CM

## 2015-09-06 DIAGNOSIS — I10 Essential (primary) hypertension: Secondary | ICD-10-CM

## 2015-09-06 MED ORDER — FUROSEMIDE 20 MG PO TABS: 20.0000 mg | ORAL_TABLET | Freq: Every day | ORAL | Status: DC

## 2015-09-06 NOTE — Telephone Encounter (Signed)
-----   Message from Lars Masson, MD sent at 09/06/2015 10:05 AM EDT ----- I would cut lasix to 20 mg po daily. Repeat CMP at the next visit.

## 2015-09-06 NOTE — Telephone Encounter (Signed)
Notified the pt that per Dr Delton See, her labs indicate that we should cut her lasix to 20 mg po daily and repeat a cmet at her next OV with Dr Delton See on 12/07/15. Confirmed the pharmacy of choice with the pt.  Scheduled the pt a lab appt, same day she see's Dr Delton See in the clinic, on 12/07/15 to recheck a cmet.  Pt verbalized understanding and agrees with this plan.

## 2015-10-10 ENCOUNTER — Ambulatory Visit (HOSPITAL_COMMUNITY): Payer: Medicare Other | Attending: Cardiovascular Disease

## 2015-10-10 ENCOUNTER — Other Ambulatory Visit (HOSPITAL_COMMUNITY): Payer: Self-pay

## 2015-10-10 ENCOUNTER — Encounter (INDEPENDENT_AMBULATORY_CARE_PROVIDER_SITE_OTHER): Payer: Self-pay

## 2015-10-10 DIAGNOSIS — I519 Heart disease, unspecified: Secondary | ICD-10-CM | POA: Diagnosis not present

## 2015-10-10 DIAGNOSIS — I428 Other cardiomyopathies: Secondary | ICD-10-CM | POA: Diagnosis not present

## 2015-10-10 DIAGNOSIS — I429 Cardiomyopathy, unspecified: Secondary | ICD-10-CM | POA: Diagnosis not present

## 2015-10-10 DIAGNOSIS — I351 Nonrheumatic aortic (valve) insufficiency: Secondary | ICD-10-CM | POA: Insufficient documentation

## 2015-10-10 DIAGNOSIS — I110 Hypertensive heart disease with heart failure: Secondary | ICD-10-CM | POA: Insufficient documentation

## 2015-10-10 DIAGNOSIS — I255 Ischemic cardiomyopathy: Secondary | ICD-10-CM | POA: Diagnosis not present

## 2015-10-10 DIAGNOSIS — E785 Hyperlipidemia, unspecified: Secondary | ICD-10-CM | POA: Diagnosis not present

## 2015-10-10 DIAGNOSIS — I509 Heart failure, unspecified: Secondary | ICD-10-CM | POA: Diagnosis not present

## 2015-10-10 DIAGNOSIS — I340 Nonrheumatic mitral (valve) insufficiency: Secondary | ICD-10-CM | POA: Insufficient documentation

## 2015-10-10 DIAGNOSIS — E119 Type 2 diabetes mellitus without complications: Secondary | ICD-10-CM | POA: Insufficient documentation

## 2015-10-23 DIAGNOSIS — E782 Mixed hyperlipidemia: Secondary | ICD-10-CM | POA: Diagnosis not present

## 2015-10-24 DIAGNOSIS — M25551 Pain in right hip: Secondary | ICD-10-CM | POA: Insufficient documentation

## 2015-11-02 DIAGNOSIS — M1611 Unilateral primary osteoarthritis, right hip: Secondary | ICD-10-CM | POA: Diagnosis not present

## 2015-11-05 DIAGNOSIS — M19012 Primary osteoarthritis, left shoulder: Secondary | ICD-10-CM | POA: Diagnosis not present

## 2015-11-06 DIAGNOSIS — R2689 Other abnormalities of gait and mobility: Secondary | ICD-10-CM | POA: Diagnosis not present

## 2015-11-06 DIAGNOSIS — M1611 Unilateral primary osteoarthritis, right hip: Secondary | ICD-10-CM | POA: Diagnosis not present

## 2015-11-06 DIAGNOSIS — M6281 Muscle weakness (generalized): Secondary | ICD-10-CM | POA: Diagnosis not present

## 2015-11-06 DIAGNOSIS — M25551 Pain in right hip: Secondary | ICD-10-CM | POA: Diagnosis not present

## 2015-11-06 DIAGNOSIS — M25351 Other instability, right hip: Secondary | ICD-10-CM | POA: Diagnosis not present

## 2015-11-07 DIAGNOSIS — M1611 Unilateral primary osteoarthritis, right hip: Secondary | ICD-10-CM | POA: Diagnosis not present

## 2015-11-14 DIAGNOSIS — M25351 Other instability, right hip: Secondary | ICD-10-CM | POA: Diagnosis not present

## 2015-11-14 DIAGNOSIS — M6281 Muscle weakness (generalized): Secondary | ICD-10-CM | POA: Diagnosis not present

## 2015-11-14 DIAGNOSIS — M1611 Unilateral primary osteoarthritis, right hip: Secondary | ICD-10-CM | POA: Diagnosis not present

## 2015-11-14 DIAGNOSIS — R2689 Other abnormalities of gait and mobility: Secondary | ICD-10-CM | POA: Diagnosis not present

## 2015-11-14 DIAGNOSIS — M25551 Pain in right hip: Secondary | ICD-10-CM | POA: Diagnosis not present

## 2015-11-16 DIAGNOSIS — M1611 Unilateral primary osteoarthritis, right hip: Secondary | ICD-10-CM | POA: Diagnosis not present

## 2015-11-16 DIAGNOSIS — M6281 Muscle weakness (generalized): Secondary | ICD-10-CM | POA: Diagnosis not present

## 2015-11-16 DIAGNOSIS — M25551 Pain in right hip: Secondary | ICD-10-CM | POA: Diagnosis not present

## 2015-11-16 DIAGNOSIS — M25351 Other instability, right hip: Secondary | ICD-10-CM | POA: Diagnosis not present

## 2015-11-16 DIAGNOSIS — R2689 Other abnormalities of gait and mobility: Secondary | ICD-10-CM | POA: Diagnosis not present

## 2015-11-23 ENCOUNTER — Encounter: Payer: Self-pay | Admitting: Cardiology

## 2015-12-04 DIAGNOSIS — M25351 Other instability, right hip: Secondary | ICD-10-CM | POA: Diagnosis not present

## 2015-12-04 DIAGNOSIS — R2689 Other abnormalities of gait and mobility: Secondary | ICD-10-CM | POA: Diagnosis not present

## 2015-12-04 DIAGNOSIS — M25551 Pain in right hip: Secondary | ICD-10-CM | POA: Diagnosis not present

## 2015-12-04 DIAGNOSIS — M6281 Muscle weakness (generalized): Secondary | ICD-10-CM | POA: Diagnosis not present

## 2015-12-04 DIAGNOSIS — M1611 Unilateral primary osteoarthritis, right hip: Secondary | ICD-10-CM | POA: Diagnosis not present

## 2015-12-07 ENCOUNTER — Encounter: Payer: Self-pay | Admitting: Cardiology

## 2015-12-07 ENCOUNTER — Ambulatory Visit (INDEPENDENT_AMBULATORY_CARE_PROVIDER_SITE_OTHER): Payer: Medicare Other | Admitting: Cardiology

## 2015-12-07 ENCOUNTER — Other Ambulatory Visit: Payer: Medicare Other | Admitting: *Deleted

## 2015-12-07 VITALS — BP 138/68 | HR 58 | Ht 62.0 in | Wt 184.0 lb

## 2015-12-07 DIAGNOSIS — R799 Abnormal finding of blood chemistry, unspecified: Secondary | ICD-10-CM

## 2015-12-07 DIAGNOSIS — I10 Essential (primary) hypertension: Secondary | ICD-10-CM

## 2015-12-07 DIAGNOSIS — Z889 Allergy status to unspecified drugs, medicaments and biological substances status: Secondary | ICD-10-CM

## 2015-12-07 DIAGNOSIS — I428 Other cardiomyopathies: Secondary | ICD-10-CM

## 2015-12-07 DIAGNOSIS — I519 Heart disease, unspecified: Secondary | ICD-10-CM

## 2015-12-07 DIAGNOSIS — Z789 Other specified health status: Secondary | ICD-10-CM

## 2015-12-07 DIAGNOSIS — I429 Cardiomyopathy, unspecified: Secondary | ICD-10-CM

## 2015-12-07 DIAGNOSIS — I5021 Acute systolic (congestive) heart failure: Secondary | ICD-10-CM

## 2015-12-07 DIAGNOSIS — I5041 Acute combined systolic (congestive) and diastolic (congestive) heart failure: Secondary | ICD-10-CM | POA: Diagnosis not present

## 2015-12-07 DIAGNOSIS — R748 Abnormal levels of other serum enzymes: Secondary | ICD-10-CM | POA: Diagnosis not present

## 2015-12-07 DIAGNOSIS — R7989 Other specified abnormal findings of blood chemistry: Secondary | ICD-10-CM

## 2015-12-07 DIAGNOSIS — I2510 Atherosclerotic heart disease of native coronary artery without angina pectoris: Secondary | ICD-10-CM | POA: Diagnosis not present

## 2015-12-07 LAB — COMPREHENSIVE METABOLIC PANEL
ALT: 9 U/L (ref 6–29)
AST: 12 U/L (ref 10–35)
Albumin: 4.1 g/dL (ref 3.6–5.1)
Alkaline Phosphatase: 68 U/L (ref 33–130)
BUN: 22 mg/dL (ref 7–25)
CO2: 27 mmol/L (ref 20–31)
Calcium: 9.9 mg/dL (ref 8.6–10.4)
Chloride: 106 mmol/L (ref 98–110)
Creat: 1.37 mg/dL — ABNORMAL HIGH (ref 0.60–0.93)
Glucose, Bld: 115 mg/dL — ABNORMAL HIGH (ref 65–99)
Potassium: 4.4 mmol/L (ref 3.5–5.3)
Sodium: 142 mmol/L (ref 135–146)
Total Bilirubin: 0.6 mg/dL (ref 0.2–1.2)
Total Protein: 6.2 g/dL (ref 6.1–8.1)

## 2015-12-07 NOTE — Progress Notes (Signed)
Cardiology Office Note   Date:  12/07/2015   ID:  Jennifer Kaufman, Jennifer Kaufman [birth year], MRN [id number]  PCP:  Dina Rich, MD  Cardiologist:  Dr. Delton See    Chief complain: 71 follow-up, muscle pain.   History of Present Illness: Jennifer Kaufman is a 70 y.o. female who presents for post hospitalization for acute systolic and diastolic HF with EF 20-25%. Admit 06/22/15    D/C'd4/12/17  Past medical history of HTN, HLD, DM and gout however no past cardiac history prior to this admit.  She was diuresed 2 L  -allergic to lisinopril on ARB.  Not much change in wt.  Cr 1.25.   She developed generalized joint and muscle pain after starting atorvastatin, we switched to pravastatin that she is tolerating well. Today she states that she feels really good, she is able to do most activities of daily living, she denies any lower extremity edema orthopnea or paroxysmal nocturnal dyspnea no chest pain or dyspnea on exertion.  and she discontinued however her primary care physician instructed her to restart again. She says that her weight has been stable she has been tolerating her medications well and overall she feels much better since she was discharged from the hospital. She denies any orthopnea paroxysmal nocturnal dyspnea no palpitations or syncope. She has been compliant to her medicines.   Past Medical History:  Diagnosis Date  . Anemia   . Arthritis    "all over"  . CHF (congestive heart failure) (HCC) dx'd 06/22/2015  . Gout   . High cholesterol   . History of blood transfusion    'w/hysterectomy"  . Hypertension   . Nonischemic cardiomyopathy (HCC)    a. echo 06/22/15 EF of 20-25% with akinesis of the anterior wall b. R & L cath 06/25/15 -nonobstructive RCA (65%) lesion by FFR, Elevated LVEDP --> medical managment   . Type II diabetes mellitus (HCC)     Past Surgical History:  Procedure Laterality Date  . ABDOMINAL HYSTERECTOMY    . CARDIAC CATHETERIZATION N/A 06/25/2015   Procedure: Right/Left  Heart Cath and Coronary Angiography;  Surgeon: Marykay Lex, MD;  Location: [facility] INVASIVE CV LAB;  Service: Cardiovascular;  Laterality: N/A;  . CARPAL TUNNEL RELEASE Bilateral   . JOINT REPLACEMENT    . MENISCUS REPAIR Left    "scoped"  . THYROID LOBECTOMY    . TOTAL HIP ARTHROPLASTY Left 2000  . TUBAL LIGATION       Current Outpatient Prescriptions  Medication Sig Dispense Refill  . aspirin EC 81 MG EC tablet Take 1 tablet (81 mg total) by mouth daily.    . colchicine (COLCRYS) 0.6 MG tablet Take 0.6-1.2 mg by mouth daily as needed (1.2 mg at gout onset, 0.6 mg 1 hour later).     . furosemide (LASIX) 20 MG tablet Take 1 tablet (20 mg total) by mouth daily. 90 tablet 3  . metFORMIN (GLUCOPHAGE) 1000 MG tablet Take 1,000 mg by mouth 2 (two) times daily with a meal.     . metoprolol tartrate (LOPRESSOR) 25 MG tablet Take 1 tablet (25 mg total) by mouth 2 (two) times daily. 60 tablet 6  . nitroGLYCERIN (NITROSTAT) 0.4 MG SL tablet Place 1 tablet (0.4 mg total) under the tongue every 5 (five) minutes as needed for chest pain. 25 tablet 2  . potassium chloride SA (K-DUR,KLOR-CON) 10 MEQ tablet Take 1 tablet (10 mEq total) by mouth daily. 30 tablet 6  . pravastatin (PRAVACHOL) 20 MG tablet Take  1 tablet (20 mg total) by mouth every evening. 90 tablet 3  . spironolactone (ALDACTONE) 25 MG tablet Take 0.5 tablets (12.5 mg total) by mouth daily. 30 tablet 6  . ULORIC 40 MG tablet Take 40 mg by mouth daily.   11  . valsartan (DIOVAN) 320 MG tablet Take 320 mg by mouth daily.      No current facility-administered medications for this visit.     Allergies:   Atorvastatin; Carvedilol; Hydrochlorothiazide; and Lisinopril    Social History:  The patient  reports that she has never smoked. Her smokeless tobacco use includes Snuff. She reports that she does not drink alcohol or use drugs.   Family History:  The patient's family history includes Hypertension in her mother.    ROS:  General:no  colds or fevers, no weight changes Skin:no rashes or ulcers HEENT:no blurred vision, no congestion CV:see HPI PUL:see HPI GI:no diarrhea constipation or melena, no indigestion GU:no hematuria, no dysuria MS:no joint pain, no claudication Neuro:no syncope, no lightheadedness Endo:+ diabetes, no thyroid disease  Wt Readings from Last 3 Encounters:  12/07/15 184 lb (83.5 kg)  09/05/15 181 lb (82.1 kg)  07/11/15 182 lb (82.6 kg)     PHYSICAL EXAM: VS:  BP 138/68   Pulse (!) 58   Ht 5\' 2"  (1.575 m)   Wt 184 lb (83.5 kg)   SpO2 98%   BMI 33.65 kg/m  , BMI Body mass index is 33.65 kg/m. General:Pleasant affect, NAD Skin:Warm and dry, brisk capillary refill HEENT:normocephalic, sclera clear, mucus membranes moist Neck:supple, no JVD, no bruits  Heart:S1S2 RRR without murmur, gallup, rub or click Lungs:clear without rales, rhonchi, or wheezes OAC:ZYSA, non tender, + BS, do not palpate liver spleen or masses Ext:no lower ext edema, 2+ pedal pulses, 2+ radial pulses Neuro:alert and oriented X 3, MAE, follows commands, + facial symmetry  EKG:  EKG is ordered today. The ekg ordered today demonstrates SR with LAD and LVH no acute changes from the hospital.  .    Recent Labs: 06/22/2015: B Natriuretic Peptide 1,001.5 06/26/2015: Hemoglobin 11.3; Platelets 271 07/11/2015: Magnesium 1.7 09/05/2015: ALT 6; BUN 38; Creat 1.64; Potassium 4.5; Sodium 138   Lipid Panel    Component Value Date/Time   CHOL 124 06/23/2015 0336   TRIG 113 06/23/2015 0336   HDL 34 (L) 06/23/2015 0336   CHOLHDL 3.6 06/23/2015 0336   VLDL 23 06/23/2015 0336   LDLCALC 67 06/23/2015 0336    Other studies Reviewed: Additional studies/ records that were reviewed today include: . Echo 06/22/15 - Left ventricle: The cavity size was normal. Wall thickness was  increased in a pattern of mild LVH. Systolic function was  severely reduced. The estimated ejection fraction was in the  range of 20% to 25%. Diffuse  hypokinesis. There is akinesis of  the anteroseptal and apical myocardium. Doppler parameters are  consistent with restrictive physiology, indicative of decreased  left ventricular diastolic compliance and/or increased left  atrial pressure. - Aortic valve: There was mild regurgitation. - Mitral valve: There was mild regurgitation. - Pericardium, extracardiac: A small pericardial effusion was  identified. There was a left pleural effusion.  Impressions:  - Diffuse hypokinesis with akinesis of the anteroseptal wall and  apex; overall severely reduced LV function; restrictive filling;  mild AI and MR; small pericardial effusion with mild RA collapse. _____________    Right/Left Heart Cath and Coronary Angiography 06/25/15 1.  Mid RCA lesion, 65% stenosed. FFR non-physiologically significant 2. No severe cardiomyopathy,  likely nonischemic 3. Elevated LVEDP is consistent with Pulmonary Capillary Wedge Pressure monitoring.  Nonobstructive RCA lesion by FFR Nonischemic Cardiomyopathy.  Plan:  Return to nursing unit after sheath removal.  Continue IV diuresis and optimization of medical management.  Would also treat for noneffective CAD with statin and aspirin.      TTE: 10/10/2015 Left ventricle: The cavity size was mildly dilated. Wall   thickness was increased in a pattern of moderate LVH. Systolic   function was normal. The estimated ejection fraction was in the   range of 50% to 55%. Left ventricular diastolic function   parameters were normal. - Aortic valve: There was mild regurgitation. - Mitral valve: There was mild regurgitation. - Atrial septum: No defect or patent foramen ovale was identified.   ASSESSMENT AND PLAN:  1.  Acute combined systolic and diastolic heart failure (HCC)  LVEF 20-25% in April increased to 50-55% in July 2017. The patient is asymptomatic class IIa. Her creatinine has improved from 1.6-1.3. We'll continue the same  management.  2. Congestive dilated cardiomyopathy (HCC) new on ARB, BB, diuretic, and spironolactone.   3. Essential hypertension - controlled.  4. Hyperlipidemia associated with type 2 diabetes mellitus (HCC) intolerant to Lipitor, well tolerating pravastatin.   5. Diabetes mellitus type 2 followed by PCP  6. Non obstructive CAD. On aspirin, metoprolol, valsartan and atorvastatin.   Follow-up in 6 months.   Signed, Tobias AlexanderKatarina Nianna Igo, MD  12/07/2015 8:47 AM    [facility] [address], [place], [place]  [address]/ [address] [place], [place] Phone: [phone](336) [phone]; Fax: [phone](336) [phone]  [phone]

## 2015-12-07 NOTE — Patient Instructions (Signed)
Medication Instructions:  Your physician recommends that you continue on your current medications as directed. Please refer to the Current Medication list given to you today.   Labwork: None ordered  Testing/Procedures: None ordered  Follow-Up: Your physician wants you to follow-up in: 6 months with Dr.Nelson You will receive a reminder letter in the mail two months in advance. If you don't receive a letter, please call our office to schedule the follow-up appointment.   Any Other Special Instructions Will Be Listed Below (If Applicable).     If you need a refill on your cardiac medications before your next appointment, please call your pharmacy.   

## 2015-12-14 DIAGNOSIS — M25351 Other instability, right hip: Secondary | ICD-10-CM | POA: Diagnosis not present

## 2015-12-14 DIAGNOSIS — M1611 Unilateral primary osteoarthritis, right hip: Secondary | ICD-10-CM | POA: Diagnosis not present

## 2015-12-14 DIAGNOSIS — M6281 Muscle weakness (generalized): Secondary | ICD-10-CM | POA: Diagnosis not present

## 2015-12-14 DIAGNOSIS — R2689 Other abnormalities of gait and mobility: Secondary | ICD-10-CM | POA: Diagnosis not present

## 2015-12-14 DIAGNOSIS — M25551 Pain in right hip: Secondary | ICD-10-CM | POA: Diagnosis not present

## 2015-12-19 DIAGNOSIS — M1A0790 Idiopathic chronic gout, unspecified ankle and foot, without tophus (tophi): Secondary | ICD-10-CM | POA: Diagnosis not present

## 2015-12-19 DIAGNOSIS — E782 Mixed hyperlipidemia: Secondary | ICD-10-CM | POA: Diagnosis not present

## 2015-12-19 DIAGNOSIS — Z23 Encounter for immunization: Secondary | ICD-10-CM | POA: Diagnosis not present

## 2015-12-19 DIAGNOSIS — I10 Essential (primary) hypertension: Secondary | ICD-10-CM | POA: Diagnosis not present

## 2015-12-19 DIAGNOSIS — E118 Type 2 diabetes mellitus with unspecified complications: Secondary | ICD-10-CM | POA: Diagnosis not present

## 2016-01-08 ENCOUNTER — Telehealth: Payer: Self-pay | Admitting: Cardiology

## 2016-01-08 DIAGNOSIS — I420 Dilated cardiomyopathy: Secondary | ICD-10-CM

## 2016-01-08 DIAGNOSIS — I5041 Acute combined systolic (congestive) and diastolic (congestive) heart failure: Secondary | ICD-10-CM

## 2016-01-08 DIAGNOSIS — I5021 Acute systolic (congestive) heart failure: Secondary | ICD-10-CM

## 2016-01-08 NOTE — Telephone Encounter (Signed)
New message      Daughter states pt now weighs 190lbs.  She denies sob and is not sure about feet/ankles swelling.  Please call pt at [phone]

## 2016-01-08 NOTE — Telephone Encounter (Signed)
Have her increase lasix to 40 mg po daily and follow up in 1 week in our office with BMP and BNP. Thank you

## 2016-01-08 NOTE — Telephone Encounter (Signed)
Pt calling to inform Dr. Delton See that over the course of 1 week, her weight has gone up from 184 lbs to 190 lbs.  Pt reports she has only "mild sob." Pt reports sob mostly while exerting, and occasionally at rest.  Pt complains of no chest pain, palpitations, pre-syncopal or syncopal issues, no cough, no swelling in her upper and lower extremities.  Pt reports being compliant with taking all cardiac meds prescribed.  Pt reports she hasn't changed her diet too much, but she states she should probably limit her sodium intake a little more.  Pt reports she is not having any difficulty resting at night, and does not have to prop her head on multiple pillows to facilitate ease in breathing. Pt was able to speak in clear full sentences over the phone, with no difficulty in breathing while communicating with myself. Pt states she will be seeing her PCP for this issue on this Thursday 10/26, for she states "this weight gain scared me a bit." Pt reports her PCP is Dr. Gala Romney at [facility] in [place].  Informed the pt that being she is in no acute distress at this time, I will send this message to Dr. Delton See to review and advise on, and follow-up with the pt shortly thereafter.  Advised the pt to limit her sodium intake to a very minimum. Advised the pt to continue her current regimen until otherwise advised.  Advised the pt to continue weighing herself every morning after urination, and log this information.  Advised the pt to contact us with her PCP's recommendations after her appt on 10/26.  Advised the pt that if she develops worsening sob, DOE, chest pain, cough, swelling in her upper and lower extremities, swelling in her abdomen, an increase in weight gain, palpitations, pre-syncopal or syncopal episodes,  then she should seek immediate medical attention or notify our office of this.  Pt verbalized understanding and agrees with this plan.

## 2016-01-09 MED ORDER — FUROSEMIDE 40 MG PO TABS: 40.0000 mg | ORAL_TABLET | Freq: Every day | ORAL | 2 refills | Status: DC

## 2016-01-09 NOTE — Telephone Encounter (Signed)
Left a message for the pt and Daughter Gabriel Rung (on [place]) to call back, to endorse recommendations per Dr Delton See, from pts complaints of weight gain yesterday.

## 2016-01-09 NOTE — Telephone Encounter (Signed)
Notified the pt that per Dr Delton See, she recommends that we increase her lasix to 40 mg po daily and follow-up in our office with a BMET and BNP in one week.  Confirmed the pharmacy of choice with the pt.  Scheduled the pt for a lab appt to check a bmet and BNP for next Wednesday 01/16/16.  Pt verbalized understanding and agrees with this plan.  Pt gracious for all the assistance provided.

## 2016-01-10 DIAGNOSIS — I420 Dilated cardiomyopathy: Secondary | ICD-10-CM | POA: Diagnosis not present

## 2016-01-16 ENCOUNTER — Other Ambulatory Visit: Payer: Medicare Other

## 2016-01-21 ENCOUNTER — Other Ambulatory Visit: Payer: Medicare Other | Admitting: *Deleted

## 2016-01-21 DIAGNOSIS — I420 Dilated cardiomyopathy: Secondary | ICD-10-CM

## 2016-01-21 DIAGNOSIS — I5041 Acute combined systolic (congestive) and diastolic (congestive) heart failure: Secondary | ICD-10-CM | POA: Diagnosis not present

## 2016-01-21 DIAGNOSIS — I5021 Acute systolic (congestive) heart failure: Secondary | ICD-10-CM | POA: Diagnosis not present

## 2016-01-21 LAB — BASIC METABOLIC PANEL
BUN: 15 mg/dL (ref 7–25)
CO2: 30 mmol/L (ref 20–31)
Calcium: 9.1 mg/dL (ref 8.6–10.4)
Chloride: 105 mmol/L (ref 98–110)
Creat: 1.07 mg/dL — ABNORMAL HIGH (ref 0.60–0.93)
Glucose, Bld: 125 mg/dL — ABNORMAL HIGH (ref 65–99)
Potassium: 3.6 mmol/L (ref 3.5–5.3)
Sodium: 144 mmol/L (ref 135–146)

## 2016-01-21 LAB — BRAIN NATRIURETIC PEPTIDE: Brain Natriuretic Peptide: 123.7 pg/mL — ABNORMAL HIGH (ref <100)

## 2016-01-25 ENCOUNTER — Other Ambulatory Visit: Payer: Self-pay | Admitting: *Deleted

## 2016-01-25 MED ORDER — METOPROLOL TARTRATE 25 MG PO TABS: 25.0000 mg | ORAL_TABLET | Freq: Two times a day (BID) | ORAL | 9 refills | Status: DC

## 2016-01-25 MED ORDER — POTASSIUM CHLORIDE CRYS ER 10 MEQ PO TBCR: 10.0000 meq | EXTENDED_RELEASE_TABLET | Freq: Every day | ORAL | 9 refills | Status: DC

## 2016-02-25 DIAGNOSIS — E782 Mixed hyperlipidemia: Secondary | ICD-10-CM | POA: Diagnosis not present

## 2016-02-25 DIAGNOSIS — R739 Hyperglycemia, unspecified: Secondary | ICD-10-CM | POA: Diagnosis not present

## 2016-04-16 DIAGNOSIS — Z0000 Encounter for general adult medical examination without abnormal findings: Secondary | ICD-10-CM | POA: Diagnosis not present

## 2016-04-16 DIAGNOSIS — E118 Type 2 diabetes mellitus with unspecified complications: Secondary | ICD-10-CM | POA: Diagnosis not present

## 2016-06-02 ENCOUNTER — Encounter: Payer: Self-pay | Admitting: *Deleted

## 2016-06-11 ENCOUNTER — Ambulatory Visit: Payer: Medicare Other | Admitting: Cardiology

## 2016-06-20 ENCOUNTER — Other Ambulatory Visit: Payer: Self-pay | Admitting: Cardiology

## 2016-06-20 ENCOUNTER — Telehealth: Payer: Self-pay | Admitting: Cardiology

## 2016-06-20 ENCOUNTER — Encounter: Payer: Self-pay | Admitting: Physician Assistant

## 2016-06-20 ENCOUNTER — Ambulatory Visit (INDEPENDENT_AMBULATORY_CARE_PROVIDER_SITE_OTHER): Payer: Medicare Other | Admitting: Cardiology

## 2016-06-20 VITALS — BP 144/82 | HR 68 | Ht 62.0 in | Wt 192.0 lb

## 2016-06-20 DIAGNOSIS — I420 Dilated cardiomyopathy: Secondary | ICD-10-CM

## 2016-06-20 DIAGNOSIS — I5041 Acute combined systolic (congestive) and diastolic (congestive) heart failure: Secondary | ICD-10-CM | POA: Diagnosis not present

## 2016-06-20 DIAGNOSIS — Z789 Other specified health status: Secondary | ICD-10-CM | POA: Diagnosis not present

## 2016-06-20 DIAGNOSIS — I5043 Acute on chronic combined systolic (congestive) and diastolic (congestive) heart failure: Secondary | ICD-10-CM | POA: Diagnosis not present

## 2016-06-20 DIAGNOSIS — I10 Essential (primary) hypertension: Secondary | ICD-10-CM

## 2016-06-20 DIAGNOSIS — E782 Mixed hyperlipidemia: Secondary | ICD-10-CM | POA: Diagnosis not present

## 2016-06-20 DIAGNOSIS — I2510 Atherosclerotic heart disease of native coronary artery without angina pectoris: Secondary | ICD-10-CM | POA: Diagnosis not present

## 2016-06-20 LAB — COMPREHENSIVE METABOLIC PANEL
ALT: 9 IU/L (ref 0–32)
AST: 17 IU/L (ref 0–40)
Albumin/Globulin Ratio: 2 (ref 1.2–2.2)
Albumin: 4.4 g/dL (ref 3.5–4.8)
Alkaline Phosphatase: 137 IU/L — ABNORMAL HIGH (ref 39–117)
BUN/Creatinine Ratio: 14 (ref 12–28)
BUN: 16 mg/dL (ref 8–27)
Bilirubin Total: 1 mg/dL (ref 0.0–1.2)
CO2: 23 mmol/L (ref 18–29)
Calcium: 9.7 mg/dL (ref 8.7–10.3)
Chloride: 102 mmol/L (ref 96–106)
Creatinine, Ser: 1.11 mg/dL — ABNORMAL HIGH (ref 0.57–1.00)
GFR calc Af Amer: 58 mL/min/{1.73_m2} — ABNORMAL LOW (ref >59)
GFR calc non Af Amer: 50 mL/min/{1.73_m2} — ABNORMAL LOW (ref >59)
Globulin, Total: 2.2 g/dL (ref 1.5–4.5)
Glucose: 140 mg/dL — ABNORMAL HIGH (ref 65–99)
Potassium: 4 mmol/L (ref 3.5–5.2)
Sodium: 144 mmol/L (ref 134–144)
Total Protein: 6.6 g/dL (ref 6.0–8.5)

## 2016-06-20 LAB — CBC WITH DIFFERENTIAL/PLATELET
Basophils Absolute: 0 10*3/uL (ref 0.0–0.2)
Basos: 1 %
EOS (ABSOLUTE): 0.3 10*3/uL (ref 0.0–0.4)
Eos: 5 %
Hematocrit: 35 % (ref 34.0–46.6)
Hemoglobin: 12.7 g/dL (ref 11.1–15.9)
Immature Grans (Abs): 0 10*3/uL (ref 0.0–0.1)
Immature Granulocytes: 0 %
Lymphocytes Absolute: 1.8 10*3/uL (ref 0.7–3.1)
Lymphs: 36 %
MCH: 32 pg (ref 26.6–33.0)
MCHC: 36.3 g/dL — ABNORMAL HIGH (ref 31.5–35.7)
MCV: 88 fL (ref 79–97)
Monocytes Absolute: 0.3 10*3/uL (ref 0.1–0.9)
Monocytes: 6 %
Neutrophils Absolute: 2.7 10*3/uL (ref 1.4–7.0)
Neutrophils: 52 %
Platelets: 252 10*3/uL (ref 150–379)
RBC: 3.97 x10E6/uL (ref 3.77–5.28)
RDW: 19.5 % — ABNORMAL HIGH (ref 12.3–15.4)
WBC: 5.1 10*3/uL (ref 3.4–10.8)

## 2016-06-20 LAB — LIPID PANEL
Chol/HDL Ratio: 3.8 ratio (ref 0.0–4.4)
Cholesterol, Total: 132 mg/dL (ref 100–199)
HDL: 35 mg/dL — ABNORMAL LOW (ref >39)
LDL Calculated: 67 mg/dL (ref 0–99)
Triglycerides: 152 mg/dL — ABNORMAL HIGH (ref 0–149)
VLDL Cholesterol Cal: 30 mg/dL (ref 5–40)

## 2016-06-20 LAB — TSH: TSH: 4.75 u[IU]/mL — ABNORMAL HIGH (ref 0.450–4.500)

## 2016-06-20 LAB — PRO B NATRIURETIC PEPTIDE: NT-Pro BNP: 206 pg/mL (ref 0–301)

## 2016-06-20 MED ORDER — FUROSEMIDE 20 MG PO TABS: 40.0000 mg | ORAL_TABLET | Freq: Every day | ORAL | 3 refills | Status: DC

## 2016-06-20 MED ORDER — POTASSIUM CHLORIDE CRYS ER 10 MEQ PO TBCR: 10.0000 meq | EXTENDED_RELEASE_TABLET | Freq: Every day | ORAL | 3 refills | Status: DC

## 2016-06-20 MED ORDER — NITROGLYCERIN 0.4 MG SL SUBL: 0.4000 mg | SUBLINGUAL_TABLET | SUBLINGUAL | 6 refills | Status: DC | PRN

## 2016-06-20 MED ORDER — SPIRONOLACTONE 25 MG PO TABS: 25.0000 mg | ORAL_TABLET | Freq: Every day | ORAL | 3 refills | Status: DC

## 2016-06-20 MED ORDER — VALSARTAN 320 MG PO TABS: 320.0000 mg | ORAL_TABLET | Freq: Every day | ORAL | 3 refills | Status: DC

## 2016-06-20 MED ORDER — METOPROLOL TARTRATE 25 MG PO TABS: 25.0000 mg | ORAL_TABLET | Freq: Two times a day (BID) | ORAL | 9 refills | Status: DC

## 2016-06-20 NOTE — Telephone Encounter (Signed)
Advised patient's daughter that order for hospital bed and other long term care processes need to be referred to PCP. Daughter states patient has an appointment with Dr. Sol Passer on Monday and asked that we forward lab results to him. Daughter thanked me for the call.

## 2016-06-20 NOTE — Patient Instructions (Addendum)
Medication Instructions:  INCREASE Lasix (Furosemide) to 40 mg once daily INCREASE Spironolactone (Aldactone) to 25 mg once daily   Labwork: TODAY - BNP, CMET, CBC, Cholesterol, TSH   Testing/Procedures: None Ordered   Follow-Up: Your physician recommends that you schedule a follow-up appointment in: 2-3 weeks with Dr. Delton See or APP   If you need a refill on your cardiac medications before your next appointment, please call your pharmacy.   Thank you for choosing [facility]! Eligha Bridegroom, RN [phone]

## 2016-06-20 NOTE — Addendum Note (Signed)
Addended by: Evamaria Detore K on: 06/20/2016 08:53 AM   Modules accepted: Orders  

## 2016-06-20 NOTE — Telephone Encounter (Signed)
New message    Pt daughter is calling to ask about a hospital bed for her mother. She states she forgot to ask when they were here for appt. She said she isn't sure it this is something Dr. Delton See would do, or her PCP.

## 2016-06-20 NOTE — Progress Notes (Signed)
Cardiology Office Note   Date:  06/20/2016   ID:  Jennifer Kaufman, Jennifer Kaufman 10-22-1945, MRN [id number]  PCP:  Jennifer Rich, MD  Cardiologist:  Dr. Delton See    Chief complain: LE edema, DOE.   History of Present Illness: Jennifer Kaufman is a 71 y.o. female who presents for post hospitalization for acute systolic and diastolic HF with EF 20-25%. Admit 06/22/15    D/C'd4/12/17  Past medical history of HTN, HLD, DM and gout however no past cardiac history prior to this admit.  She was diuresed 2 L  -allergic to lisinopril on ARB.  Not much change in wt.  Cr 1.25.   She developed generalized joint and muscle pain after starting atorvastatin, we switched to pravastatin that she is tolerating well. Today she states that she feels really good, she is able to do most activities of daily living, she denies any lower extremity edema orthopnea or paroxysmal nocturnal dyspnea no chest pain or dyspnea on exertion.  and she discontinued however her primary care physician instructed her to restart again. She says that her weight has been stable she has been tolerating her medications well and overall she feels much better since she was discharged from the hospital. She denies any orthopnea paroxysmal nocturnal dyspnea no palpitations or syncope. She has been compliant to her medicines.   06/20/2016 - 6 months follow-up, the patient is coming complaining of increasing weight, worsening lower extremity edema to the point she was not able to put her shoe on. She denies any chest pain, but has noticed worsening dyspnea on exertion. She denies any paroxysmal nocturnal dyspnea, but has two-pillow orthopnea. Denies any palpitations or syncope. She has been compliant with meds.  Past Medical History:  Diagnosis Date  . Anemia   . Arthritis    "all over"  . CHF (congestive heart failure) (HCC) dx'd 06/22/2015  . Gout   . High cholesterol   . History of blood transfusion    'w/hysterectomy"  . Hypertension   . Nonischemic  cardiomyopathy (HCC)    a. echo 06/22/15 EF of 20-25% with akinesis of the anterior wall b. R & L cath 06/25/15 -nonobstructive RCA (65%) lesion by FFR, Elevated LVEDP --> medical managment   . Type II diabetes mellitus (HCC)     Past Surgical History:  Procedure Laterality Date  . ABDOMINAL HYSTERECTOMY    . CARDIAC CATHETERIZATION N/A 06/25/2015   Procedure: Right/Left Heart Cath and Coronary Angiography;  Surgeon: Marykay Lex, MD;  Location: [facility] INVASIVE CV LAB;  Service: Cardiovascular;  Laterality: N/A;  . CARPAL TUNNEL RELEASE Bilateral   . JOINT REPLACEMENT    . MENISCUS REPAIR Left    "scoped"  . THYROID LOBECTOMY    . TOTAL HIP ARTHROPLASTY Left 2000  . TUBAL LIGATION       Current Outpatient Prescriptions  Medication Sig Dispense Refill  . aspirin EC 81 MG EC tablet Take 1 tablet (81 mg total) by mouth daily.    Marland Kitchen atorvastatin (LIPITOR) 80 MG tablet Take 80 mg by mouth.    . metoprolol tartrate (LOPRESSOR) 25 MG tablet Take 1 tablet (25 mg total) by mouth 2 (two) times daily. 60 tablet 9  . nitroGLYCERIN (NITROSTAT) 0.4 MG SL tablet Place 1 tablet (0.4 mg total) under the tongue every 5 (five) minutes as needed for chest pain. 25 tablet 6  . potassium chloride (K-DUR,KLOR-CON) 10 MEQ tablet Take 1 tablet (10 mEq total) by mouth daily. 90 tablet 3  .  ULORIC 40 MG tablet Take 40 mg by mouth daily.   11  . valsartan (DIOVAN) 320 MG tablet Take 1 tablet (320 mg total) by mouth daily. 90 tablet 3  . furosemide (LASIX) 20 MG tablet Take 2 tablets (40 mg total) by mouth daily. 180 tablet 3  . spironolactone (ALDACTONE) 25 MG tablet Take 1 tablet (25 mg total) by mouth daily. 90 tablet 3   No current facility-administered medications for this visit.     Allergies:   Atorvastatin; Carvedilol; Hydrochlorothiazide; and Lisinopril    Social History:  The patient  reports that she has never smoked. Her smokeless tobacco use includes Snuff. She reports that she does not drink alcohol  or use drugs.   Family History:  The patient's family history includes Hypertension in her mother.    ROS:  General:no colds or fevers, no weight changes Skin:no rashes or ulcers HEENT:no blurred vision, no congestion CV:see HPI PUL:see HPI GI:no diarrhea constipation or melena, no indigestion GU:no hematuria, no dysuria MS:no joint pain, no claudication Neuro:no syncope, no lightheadedness Endo:+ diabetes, no thyroid disease  Wt Readings from Last 3 Encounters:  06/20/16 192 lb (87.1 kg)  12/07/15 184 lb (83.5 kg)  09/05/15 181 lb (82.1 kg)     PHYSICAL EXAM: VS:  BP (!) 144/82   Pulse 68   Ht 5\' 2"  (1.575 m)   Wt 192 lb (87.1 kg)   BMI 35.12 kg/m  , BMI Body mass index is 35.12 kg/m. General:Pleasant affect, NAD Skin:Warm and dry, brisk capillary refill HEENT:normocephalic, sclera clear, mucus membranes moist Neck:supple, no JVD, no bruits  Heart:S1S2 RRR without murmur, gallup, rub or click Lungs: crackles at both bases, rhonchi, or wheezes ZOX:WRUE, non tender, + BS, do not palpate liver spleen or masses Ext: +2 LE edema, 2+ pedal pulses, 2+ radial pulses Neuro:alert and oriented X 3, MAE, follows commands, + facial symmetry  EKG:  EKG is ordered today. The ekg ordered today demonstrates SR with LAD and LVH no acute changes from the hospital.  .    Recent Labs: 06/26/2015: Hemoglobin 11.3; Platelets 271 07/11/2015: Magnesium 1.7 12/07/2015: ALT 9 01/21/2016: Brain Natriuretic Peptide 123.7; BUN 15; Creat 1.07; Potassium 3.6; Sodium 144   Lipid Panel    Component Value Date/Time   CHOL 124 06/23/2015 0336   TRIG 113 06/23/2015 0336   HDL 34 (L) 06/23/2015 0336   CHOLHDL 3.6 06/23/2015 0336   VLDL 23 06/23/2015 0336   LDLCALC 67 06/23/2015 0336    Other studies Reviewed: Additional studies/ records that were reviewed today include: . Echo 06/22/15 - Left ventricle: The cavity size was normal. Wall thickness was  increased in a pattern of mild LVH. Systolic  function was  severely reduced. The estimated ejection fraction was in the  range of 20% to 25%. Diffuse hypokinesis. There is akinesis of  the anteroseptal and apical myocardium. Doppler parameters are  consistent with restrictive physiology, indicative of decreased  left ventricular diastolic compliance and/or increased left  atrial pressure. - Aortic valve: There was mild regurgitation. - Mitral valve: There was mild regurgitation. - Pericardium, extracardiac: A small pericardial effusion was  identified. There was a left pleural effusion.  Impressions:  - Diffuse hypokinesis with akinesis of the anteroseptal wall and  apex; overall severely reduced LV function; restrictive filling;  mild AI and MR; small pericardial effusion with mild RA collapse. _____________    Right/Left Heart Cath and Coronary Angiography 06/25/15 1.  Mid RCA lesion, 65% stenosed. FFR  non-physiologically significant 2. No severe cardiomyopathy, likely nonischemic 3. Elevated LVEDP is consistent with Pulmonary Capillary Wedge Pressure monitoring.  Nonobstructive RCA lesion by FFR Nonischemic Cardiomyopathy.  Plan:  Return to nursing unit after sheath removal.  Continue IV diuresis and optimization of medical management.  Would also treat for noneffective CAD with statin and aspirin.      TTE: 10/10/2015 Left ventricle: The cavity size was mildly dilated. Wall   thickness was increased in a pattern of moderate LVH. Systolic   function was normal. The estimated ejection fraction was in the   range of 50% to 55%. Left ventricular diastolic function   parameters were normal. - Aortic valve: There was mild regurgitation. - Mitral valve: There was mild regurgitation. - Atrial septum: No defect or patent foramen ovale was identified.  EKG performed today 06/20/2016 was personally reviewed, and it shows normal sinus rhythm with left axis deviation, when compared to the prior EKG negative  T waves in inferolateral leads have resolved.    ASSESSMENT AND PLAN:  1.  Acute combined systolic and diastolic heart failure (HCC)  LVEF 20-25% in April increased to 50-55% in July 2017.  The patient is coming with another episode she has gained 9 pounds in the last 6 months and has lower extremity edema and crackles lung exam. We will increase Lasix to 40 mg daily, spironolactone 25 mg daily, her creatinine was improved from 1.6-->1.3 in September 2017, we will recheck CMP, CBC, TSH, BNP today.  2. Congestive dilated cardiomyopathy (HCC) - as above also continue metoprolol and valsartan..   3. Essential hypertension - uncontrolled. We are increasing her diuretics, will recheck at the next visit.  4. Hyperlipidemia associated with type 2 diabetes mellitus (HCC) - on atorvastatin 80 mg daily we will continue.  5. Diabetes mellitus type 2 followed by PCP  6. Non obstructive CAD. On aspirin, metoprolol, valsartan and atorvastatin.   Follow-up in 2 weeks.   Signed, Tobias Alexander, MD  06/20/2016 8:49 AM    [facility] [address] [place], [place], [place]  [address]/ [address] [place], [place] Phone: [phone]; Fax: [phone]  [phone]

## 2016-06-20 NOTE — Addendum Note (Signed)
Addended by: Tonita Phoenix on: 06/20/2016 08:54 AM   Modules accepted: Orders

## 2016-06-20 NOTE — Addendum Note (Signed)
Addended by: Tonita Phoenix on: 06/20/2016 08:53 AM   Modules accepted: Orders

## 2016-06-23 DIAGNOSIS — E118 Type 2 diabetes mellitus with unspecified complications: Secondary | ICD-10-CM | POA: Diagnosis not present

## 2016-06-23 DIAGNOSIS — E782 Mixed hyperlipidemia: Secondary | ICD-10-CM | POA: Diagnosis not present

## 2016-06-24 DIAGNOSIS — G4733 Obstructive sleep apnea (adult) (pediatric): Secondary | ICD-10-CM | POA: Diagnosis not present

## 2016-06-24 DIAGNOSIS — E119 Type 2 diabetes mellitus without complications: Secondary | ICD-10-CM | POA: Diagnosis not present

## 2016-06-24 DIAGNOSIS — M109 Gout, unspecified: Secondary | ICD-10-CM | POA: Diagnosis not present

## 2016-06-24 DIAGNOSIS — I10 Essential (primary) hypertension: Secondary | ICD-10-CM | POA: Diagnosis not present

## 2016-06-24 DIAGNOSIS — E782 Mixed hyperlipidemia: Secondary | ICD-10-CM | POA: Diagnosis not present

## 2016-06-24 DIAGNOSIS — I420 Dilated cardiomyopathy: Secondary | ICD-10-CM | POA: Diagnosis not present

## 2016-06-27 DIAGNOSIS — E119 Type 2 diabetes mellitus without complications: Secondary | ICD-10-CM | POA: Diagnosis not present

## 2016-06-27 DIAGNOSIS — M109 Gout, unspecified: Secondary | ICD-10-CM | POA: Diagnosis not present

## 2016-06-27 DIAGNOSIS — E782 Mixed hyperlipidemia: Secondary | ICD-10-CM | POA: Diagnosis not present

## 2016-06-27 DIAGNOSIS — I10 Essential (primary) hypertension: Secondary | ICD-10-CM | POA: Diagnosis not present

## 2016-06-27 DIAGNOSIS — I420 Dilated cardiomyopathy: Secondary | ICD-10-CM | POA: Diagnosis not present

## 2016-06-27 DIAGNOSIS — G4733 Obstructive sleep apnea (adult) (pediatric): Secondary | ICD-10-CM | POA: Diagnosis not present

## 2016-07-01 DIAGNOSIS — M109 Gout, unspecified: Secondary | ICD-10-CM | POA: Diagnosis not present

## 2016-07-01 DIAGNOSIS — G4733 Obstructive sleep apnea (adult) (pediatric): Secondary | ICD-10-CM | POA: Diagnosis not present

## 2016-07-01 DIAGNOSIS — I10 Essential (primary) hypertension: Secondary | ICD-10-CM | POA: Diagnosis not present

## 2016-07-01 DIAGNOSIS — E782 Mixed hyperlipidemia: Secondary | ICD-10-CM | POA: Diagnosis not present

## 2016-07-01 DIAGNOSIS — E119 Type 2 diabetes mellitus without complications: Secondary | ICD-10-CM | POA: Diagnosis not present

## 2016-07-01 DIAGNOSIS — I420 Dilated cardiomyopathy: Secondary | ICD-10-CM | POA: Diagnosis not present

## 2016-07-02 DIAGNOSIS — E782 Mixed hyperlipidemia: Secondary | ICD-10-CM | POA: Diagnosis not present

## 2016-07-02 DIAGNOSIS — I420 Dilated cardiomyopathy: Secondary | ICD-10-CM | POA: Diagnosis not present

## 2016-07-02 DIAGNOSIS — E118 Type 2 diabetes mellitus with unspecified complications: Secondary | ICD-10-CM | POA: Diagnosis not present

## 2016-07-02 DIAGNOSIS — I10 Essential (primary) hypertension: Secondary | ICD-10-CM | POA: Diagnosis not present

## 2016-07-02 DIAGNOSIS — G4733 Obstructive sleep apnea (adult) (pediatric): Secondary | ICD-10-CM | POA: Diagnosis not present

## 2016-07-03 DIAGNOSIS — I10 Essential (primary) hypertension: Secondary | ICD-10-CM | POA: Diagnosis not present

## 2016-07-03 DIAGNOSIS — E119 Type 2 diabetes mellitus without complications: Secondary | ICD-10-CM | POA: Diagnosis not present

## 2016-07-03 DIAGNOSIS — M109 Gout, unspecified: Secondary | ICD-10-CM | POA: Diagnosis not present

## 2016-07-03 DIAGNOSIS — E782 Mixed hyperlipidemia: Secondary | ICD-10-CM | POA: Diagnosis not present

## 2016-07-03 DIAGNOSIS — I420 Dilated cardiomyopathy: Secondary | ICD-10-CM | POA: Diagnosis not present

## 2016-07-03 DIAGNOSIS — G4733 Obstructive sleep apnea (adult) (pediatric): Secondary | ICD-10-CM | POA: Diagnosis not present

## 2016-07-08 ENCOUNTER — Encounter (INDEPENDENT_AMBULATORY_CARE_PROVIDER_SITE_OTHER): Payer: Self-pay

## 2016-07-08 ENCOUNTER — Ambulatory Visit (INDEPENDENT_AMBULATORY_CARE_PROVIDER_SITE_OTHER): Payer: Medicare Other | Admitting: Physician Assistant

## 2016-07-08 ENCOUNTER — Encounter: Payer: Self-pay | Admitting: Physician Assistant

## 2016-07-08 VITALS — BP 110/70 | HR 67 | Ht 62.0 in | Wt 192.0 lb

## 2016-07-08 DIAGNOSIS — I420 Dilated cardiomyopathy: Secondary | ICD-10-CM | POA: Diagnosis not present

## 2016-07-08 DIAGNOSIS — E1169 Type 2 diabetes mellitus with other specified complication: Secondary | ICD-10-CM | POA: Diagnosis not present

## 2016-07-08 DIAGNOSIS — I2510 Atherosclerotic heart disease of native coronary artery without angina pectoris: Secondary | ICD-10-CM | POA: Diagnosis not present

## 2016-07-08 DIAGNOSIS — E785 Hyperlipidemia, unspecified: Secondary | ICD-10-CM

## 2016-07-08 DIAGNOSIS — I5042 Chronic combined systolic (congestive) and diastolic (congestive) heart failure: Secondary | ICD-10-CM

## 2016-07-08 DIAGNOSIS — I10 Essential (primary) hypertension: Secondary | ICD-10-CM

## 2016-07-08 LAB — BASIC METABOLIC PANEL
BUN / CREAT RATIO: 18 (ref 12–28)
BUN: 20 mg/dL (ref 8–27)
CHLORIDE: 100 mmol/L (ref 96–106)
CO2: 24 mmol/L (ref 18–29)
CREATININE: 1.11 mg/dL — AB (ref 0.57–1.00)
Calcium: 9.5 mg/dL (ref 8.7–10.3)
GFR calc Af Amer: 58 mL/min/{1.73_m2} — ABNORMAL LOW (ref >59)
GFR calc non Af Amer: 50 mL/min/{1.73_m2} — ABNORMAL LOW (ref >59)
GLUCOSE: 169 mg/dL — AB (ref 65–99)
POTASSIUM: 3.8 mmol/L (ref 3.5–5.2)
Sodium: 142 mmol/L (ref 134–144)

## 2016-07-08 NOTE — Patient Instructions (Addendum)
Medication Instructions:  Your physician recommends that you continue on your current medications as directed. Please refer to the Current Medication list given to you today.   Labwork: Your physician recommends that you return for lab work today: BMET   Testing/Procedures: None ordered   Follow-Up: Your physician wants you to follow-up in: 3-4 months with Dr. Delton See     Any Other Special Instructions Will Be Listed Below (If Applicable).  DASH Eating Plan DASH stands for "Dietary Approaches to Stop Hypertension." The DASH eating plan is a healthy eating plan that has been shown to reduce high blood pressure (hypertension). It may also reduce your risk for type 2 diabetes, heart disease, and stroke. The DASH eating plan may also help with weight loss. What are tips for following this plan? General guidelines   Avoid eating more than 2,000 mg (milligrams) of salt (sodium) a day. If you have hypertension, you may need to reduce your sodium intake to 1,500 mg a day.  Limit alcohol intake to no more than 1 drink a day for nonpregnant women and 2 drinks a day for men. One drink equals 12 oz of beer, 5 oz of wine, or 1 oz of hard liquor.  Work with your health care provider to maintain a healthy body weight or to lose weight. Ask what an ideal weight is for you.  Get at least 30 minutes of exercise that causes your heart to beat faster (aerobic exercise) most days of the week. Activities may include walking, swimming, or biking.  Work with your health care provider or diet and nutrition specialist (dietitian) to adjust your eating plan to your individual calorie needs. Reading food labels   Check food labels for the amount of sodium per serving. Choose foods with less than 5 percent of the Daily Value of sodium. Generally, foods with less than 300 mg of sodium per serving fit into this eating plan.  To find whole grains, look for the word "whole" as the first word in the ingredient  list. Shopping   Buy products labeled as "low-sodium" or "no salt added."  Buy fresh foods. Avoid canned foods and premade or frozen meals. Cooking   Avoid adding salt when cooking. Use salt-free seasonings or herbs instead of table salt or sea salt. Check with your health care provider or pharmacist before using salt substitutes.  Do not fry foods. Cook foods using healthy methods such as baking, boiling, grilling, and broiling instead.  Cook with heart-healthy oils, such as olive, canola, soybean, or sunflower oil. Meal planning    Eat a balanced diet that includes:  5 or more servings of fruits and vegetables each day. At each meal, try to fill half of your plate with fruits and vegetables.  Up to 6-8 servings of whole grains each day.  Less than 6 oz of lean meat, poultry, or fish each day. A 3-oz serving of meat is about the same size as a deck of cards. One egg equals 1 oz.  2 servings of low-fat dairy each day.  A serving of nuts, seeds, or beans 5 times each week.  Heart-healthy fats. Healthy fats called Omega-3 fatty acids are found in foods such as flaxseeds and coldwater fish, like sardines, salmon, and mackerel.  Limit how much you eat of the following:  Canned or prepackaged foods.  Food that is high in trans fat, such as fried foods.  Food that is high in saturated fat, such as fatty meat.  Sweets, desserts, sugary  drinks, and other foods with added sugar.  Full-fat dairy products.  Do not salt foods before eating.  Try to eat at least 2 vegetarian meals each week.  Eat more home-cooked food and less restaurant, buffet, and fast food.  When eating at a restaurant, ask that your food be prepared with less salt or no salt, if possible. What foods are recommended? The items listed may not be a complete list. Talk with your dietitian about what dietary choices are best for you. Grains  Whole-grain or whole-wheat bread. Whole-grain or whole-wheat pasta.  Brown rice. Orpah Cobb. Bulgur. Whole-grain and low-sodium cereals. Pita bread. Low-fat, low-sodium crackers. Whole-wheat flour tortillas. Vegetables  Fresh or frozen vegetables (raw, steamed, roasted, or grilled). Low-sodium or reduced-sodium tomato and vegetable juice. Low-sodium or reduced-sodium tomato sauce and tomato paste. Low-sodium or reduced-sodium canned vegetables. Fruits  All fresh, dried, or frozen fruit. Canned fruit in natural juice (without added sugar). Meat and other protein foods  Skinless chicken or [place]. Ground chicken or [place]. Pork with fat trimmed off. Fish and seafood. Egg whites. Dried beans, peas, or lentils. Unsalted nuts, nut butters, and seeds. Unsalted canned beans. Lean cuts of beef with fat trimmed off. Low-sodium, lean deli meat. Dairy  Low-fat (1%) or fat-free (skim) milk. Fat-free, low-fat, or reduced-fat cheeses. Nonfat, low-sodium ricotta or cottage cheese. Low-fat or nonfat yogurt. Low-fat, low-sodium cheese. Fats and oils  Soft margarine without trans fats. Vegetable oil. Low-fat, reduced-fat, or light mayonnaise and salad dressings (reduced-sodium). Canola, safflower, olive, soybean, and sunflower oils. Avocado. Seasoning and other foods  Herbs. Spices. Seasoning mixes without salt. Unsalted popcorn and pretzels. Fat-free sweets. What foods are not recommended? The items listed may not be a complete list. Talk with your dietitian about what dietary choices are best for you. Grains  Baked goods made with fat, such as croissants, muffins, or some breads. Dry pasta or rice meal packs. Vegetables  Creamed or fried vegetables. Vegetables in a cheese sauce. Regular canned vegetables (not low-sodium or reduced-sodium). Regular canned tomato sauce and paste (not low-sodium or reduced-sodium). Regular tomato and vegetable juice (not low-sodium or reduced-sodium). Rosita Fire. Olives. Fruits  Canned fruit in a light or heavy syrup. Fried fruit. Fruit in cream  or butter sauce. Meat and other protein foods  Fatty cuts of meat. Ribs. Fried meat. Tomasa Blase. Sausage. Bologna and other processed lunch meats. Salami. Fatback. Hotdogs. Bratwurst. Salted nuts and seeds. Canned beans with added salt. Canned or smoked fish. Whole eggs or egg yolks. Chicken or [place] with skin. Dairy  Whole or 2% milk, cream, and half-and-half. Whole or full-fat cream cheese. Whole-fat or sweetened yogurt. Full-fat cheese. Nondairy creamers. Whipped toppings. Processed cheese and cheese spreads. Fats and oils  Butter. Stick margarine. Lard. Shortening. Ghee. Bacon fat. Tropical oils, such as coconut, palm kernel, or palm oil. Seasoning and other foods  Salted popcorn and pretzels. Onion salt, garlic salt, seasoned salt, table salt, and sea salt. Worcestershire sauce. Tartar sauce. Barbecue sauce. Teriyaki sauce. Soy sauce, including reduced-sodium. Steak sauce. Canned and packaged gravies. Fish sauce. Oyster sauce. Cocktail sauce. Horseradish that you find on the shelf. Ketchup. Mustard. Meat flavorings and tenderizers. Bouillon cubes. Hot sauce and Tabasco sauce. Premade or packaged marinades. Premade or packaged taco seasonings. Relishes. Regular salad dressings. Where to find more information:  National Heart, Lung, and Blood Institute: PopSteam.is  American Heart Association: www.heart.org Summary  The DASH eating plan is a healthy eating plan that has been shown to reduce high blood pressure (  hypertension). It may also reduce your risk for type 2 diabetes, heart disease, and stroke.  With the DASH eating plan, you should limit salt (sodium) intake to 2,300 mg a day. If you have hypertension, you may need to reduce your sodium intake to 1,500 mg a day.  When on the DASH eating plan, aim to eat more fresh fruits and vegetables, whole grains, lean proteins, low-fat dairy, and heart-healthy fats.  Work with your health care provider or diet and nutrition specialist  (dietitian) to adjust your eating plan to your individual calorie needs. This information is not intended to replace advice given to you by your health care provider. Make sure you discuss any questions you have with your health care provider. Document Released: 02/20/2011 Document Revised: 02/25/2016 Document Reviewed: 02/25/2016 Elsevier Interactive Patient Education  2017 [employer].    If you need a refill on your cardiac medications before your next appointment, please call your pharmacy.

## 2016-07-08 NOTE — Progress Notes (Signed)
Cardiology Office Note    Date:  07/08/2016   ID:  Jennifer, Kaufman Jul 31, 1945, MRN [id number]  PCP:  Dina Rich, MD  Cardiologist: Dr. Delton See  Chief Complaint  Patient presents with  . Follow-up    History of Present Illness:  Jennifer Kaufman is a 71 y.o. female with history of chronic combined systolic and diastolic CHF LVEF 20-25% in April increased to 50-55% in July 2017. She saw Dr. Delton See 06/20/16 with 9 pound weight gain and lower extremity edema. Lasix was increased to 40 mg daily, spironolactone 25 mg daily. Labs checked that day creatinine was 1.11 potassium 4.0 BNP 123 and lipids stable.  Patient comes in today accompanied by her son. She says her swelling is much better. Her weight hasn't changed any. She does admit to putting a little bit of salt on all her food. She denies chest pain, palpitations, dyspnea, dyspnea on exertion, dizziness, or presyncope.   Past Medical History:  Diagnosis Date  . Anemia   . Arthritis    "all over"  . CHF (congestive heart failure) (HCC) dx'd 06/22/2015  . Gout   . High cholesterol   . History of blood transfusion    'w/hysterectomy"  . Hypertension   . Nonischemic cardiomyopathy (HCC)    a. echo 06/22/15 EF of 20-25% with akinesis of the anterior wall b. R & L cath 06/25/15 -nonobstructive RCA (65%) lesion by FFR, Elevated LVEDP --> medical managment   . Type II diabetes mellitus (HCC)     Past Surgical History:  Procedure Laterality Date  . ABDOMINAL HYSTERECTOMY    . CARDIAC CATHETERIZATION N/A 06/25/2015   Procedure: Right/Left Heart Cath and Coronary Angiography;  Surgeon: Marykay Lex, MD;  Location: [facility] INVASIVE CV LAB;  Service: Cardiovascular;  Laterality: N/A;  . CARPAL TUNNEL RELEASE Bilateral   . JOINT REPLACEMENT    . MENISCUS REPAIR Left    "scoped"  . THYROID LOBECTOMY    . TOTAL HIP ARTHROPLASTY Left 2000  . TUBAL LIGATION      Current Medications: Outpatient Medications Prior to Visit  Medication Sig  Dispense Refill  . aspirin EC 81 MG EC tablet Take 1 tablet (81 mg total) by mouth daily.    . furosemide (LASIX) 20 MG tablet Take 2 tablets (40 mg total) by mouth daily. 180 tablet 3  . metoprolol tartrate (LOPRESSOR) 25 MG tablet Take 1 tablet (25 mg total) by mouth 2 (two) times daily. 60 tablet 9  . nitroGLYCERIN (NITROSTAT) 0.4 MG SL tablet DISSOLVE 1 TABLET UNDER THE TONGUE EVERY 5 MINUTES AS NEEDED FOR CHEST PAIN 75 tablet 6  . potassium chloride (K-DUR,KLOR-CON) 10 MEQ tablet Take 1 tablet (10 mEq total) by mouth daily. 90 tablet 3  . spironolactone (ALDACTONE) 25 MG tablet Take 1 tablet (25 mg total) by mouth daily. 90 tablet 3  . ULORIC 40 MG tablet Take 40 mg by mouth daily.   11  . valsartan (DIOVAN) 320 MG tablet Take 1 tablet (320 mg total) by mouth daily. 90 tablet 3  . atorvastatin (LIPITOR) 80 MG tablet Take 80 mg by mouth.     No facility-administered medications prior to visit.      Allergies:   Atorvastatin; Carvedilol; Hydrochlorothiazide; and Lisinopril   Social History   Social History  . Marital status: Widowed    Spouse name: N/A  . Number of children: N/A  . Years of education: N/A   Social History Main Topics  . Smoking status:  Never Smoker  . Smokeless tobacco: Current User    Types: Snuff  . Alcohol use No  . Drug use: No  . Sexual activity: No   Other Topics Concern  . None   Social History Narrative  . None     Family History:  The patient's family history includes Hypertension in her mother.   ROS:   Please see the history of present illness.    Review of Systems  Constitution: Negative.  HENT: Negative.   Eyes: Negative.   Cardiovascular: Negative.   Respiratory: Negative.   Hematologic/Lymphatic: Negative.   Musculoskeletal: Negative.  Negative for joint pain.  Gastrointestinal: Negative.   Genitourinary: Negative.   Neurological: Negative.    All other systems reviewed and are negative.   PHYSICAL EXAM:   VS:  BP 110/70    Pulse 67   Ht 5\' 2"  (1.575 m)   Wt 192 lb (87.1 kg)   SpO2 97%   BMI 35.12 kg/m   Physical Exam  GEN: Well nourished, well developed, in no acute distress  Neck: no JVD, carotid bruits, or masses Cardiac:RRR; no murmurs, rubs, or gallops  Respiratory:  clear to auscultation bilaterally, normal work of breathing GI: soft, nontender, nondistended, + BS Ext: without cyanosis, clubbing, or edema, Good distal pulses bilaterally Psych: euthymic mood, full affect  Wt Readings from Last 3 Encounters:  07/08/16 192 lb (87.1 kg)  06/20/16 192 lb (87.1 kg)  12/07/15 184 lb (83.5 kg)      Studies/Labs Reviewed:   EKG:  EKG is not ordered today.    Recent Labs: 07/11/2015: Magnesium 1.7 01/21/2016: Brain Natriuretic Peptide 123.7 06/20/2016: ALT 9; BUN 16; Creatinine, Ser 1.11; NT-Pro BNP 206; Platelets 252; Potassium 4.0; Sodium 144; TSH 4.750   Lipid Panel    Component Value Date/Time   CHOL 132 06/20/2016 0852   TRIG 152 (H) 06/20/2016 0852   HDL 35 (L) 06/20/2016 0852   CHOLHDL 3.8 06/20/2016 0852   CHOLHDL 3.6 06/23/2015 0336   VLDL 23 06/23/2015 0336   LDLCALC 67 06/20/2016 0852    Additional studies/ records that were reviewed today include:   Echo 06/22/15 - Left ventricle: The cavity size was normal. Wall thickness was   increased in a pattern of mild LVH. Systolic function was   severely reduced. The estimated ejection fraction was in the   range of 20% to 25%. Diffuse hypokinesis. There is akinesis of   the anteroseptal and apical myocardium. Doppler parameters are   consistent with restrictive physiology, indicative of decreased   left ventricular diastolic compliance and/or increased left   atrial pressure. - Aortic valve: There was mild regurgitation. - Mitral valve: There was mild regurgitation. - Pericardium, extracardiac: A small pericardial effusion was   identified. There was a left pleural effusion.   Impressions:   - Diffuse hypokinesis with akinesis of  the anteroseptal wall and   apex; overall severely reduced LV function; restrictive filling;   mild AI and MR; small pericardial effusion with mild RA collapse. _____________          Right/Left Heart Cath and Coronary Angiography 06/25/15  1.  Mid RCA lesion, 65% stenosed. FFR non-physiologically significant 2. No severe cardiomyopathy, likely nonischemic 3. Elevated LVEDP is consistent with Pulmonary Capillary Wedge Pressure monitoring.   Nonobstructive RCA lesion by FFR Nonischemic Cardiomyopathy.   Plan:  Return to nursing unit after sheath removal.  Continue IV diuresis and optimization of medical management.  Would also treat for noneffective CAD  with statin and aspirin.         TTE: 10/10/2015 Left ventricle: The cavity size was mildly dilated. Wall   thickness was increased in a pattern of moderate LVH. Systolic   function was normal. The estimated ejection fraction was in the   range of 50% to 55%. Left ventricular diastolic function   parameters were normal. - Aortic valve: There was mild regurgitation. - Mitral valve: There was mild regurgitation. - Atrial septum: No defect or patent foramen ovale was identified.   EKG performed today 06/20/2016 was personally reviewed, and it shows normal sinus rhythm with left axis deviation, when compared to the prior EKG negative T waves in inferolateral leads have resolved.        ASSESSMENT:    1. Chronic combined systolic and diastolic heart failure (HCC)   2. Benign hypertension   3. Hyperlipidemia associated with type 2 diabetes mellitus (HCC)   4. Dilated cardiomyopathy (HCC)      PLAN:  In order of problems listed above:  Chronic combined systolic and diastolic CHF: Patient's edema has resolved with adjustment of her medications. Have gone over 2 g sodium diet. Will check renal function today to make sure kidneys are doing okay on current dose medications. Follow-up with Dr. Delton See in 3-4 months.  Benign  hypertension controlled  Hyperlipidemia on pravastatin  Dilated cardiomyopathy LVEF improved on echo July/2017. Continue valsartan  Medication Adjustments/Labs and Tests Ordered: Current medicines are reviewed at length with the patient today.  Concerns regarding medicines are outlined above.  Medication changes, Labs and Tests ordered today are listed in the Patient Instructions below. Patient Instructions  Medication Instructions:  Your physician recommends that you continue on your current medications as directed. Please refer to the Current Medication list given to you today.   Labwork: Your physician recommends that you return for lab work today: BMET   Testing/Procedures: None ordered   Follow-Up: Your physician wants you to follow-up in: 3-4 months with Dr. Delton See     Any Other Special Instructions Will Be Listed Below (If Applicable).  DASH Eating Plan DASH stands for "Dietary Approaches to Stop Hypertension." The DASH eating plan is a healthy eating plan that has been shown to reduce high blood pressure (hypertension). It may also reduce your risk for type 2 diabetes, heart disease, and stroke. The DASH eating plan may also help with weight loss. What are tips for following this plan? General guidelines   Avoid eating more than 2,000 mg (milligrams) of salt (sodium) a day. If you have hypertension, you may need to reduce your sodium intake to 1,500 mg a day.  Limit alcohol intake to no more than 1 drink a day for nonpregnant women and 2 drinks a day for men. One drink equals 12 oz of beer, 5 oz of wine, or 1 oz of hard liquor.  Work with your health care provider to maintain a healthy body weight or to lose weight. Ask what an ideal weight is for you.  Get at least 30 minutes of exercise that causes your heart to beat faster (aerobic exercise) most days of the week. Activities may include walking, swimming, or biking.  Work with your health care provider or diet  and nutrition specialist (dietitian) to adjust your eating plan to your individual calorie needs. Reading food labels   Check food labels for the amount of sodium per serving. Choose foods with less than 5 percent of the Daily Value of sodium. Generally, foods with less  than 300 mg of sodium per serving fit into this eating plan.  To find whole grains, look for the word "whole" as the first word in the ingredient list. Shopping   Buy products labeled as "low-sodium" or "no salt added."  Buy fresh foods. Avoid canned foods and premade or frozen meals. Cooking   Avoid adding salt when cooking. Use salt-free seasonings or herbs instead of table salt or sea salt. Check with your health care provider or pharmacist before using salt substitutes.  Do not fry foods. Cook foods using healthy methods such as baking, boiling, grilling, and broiling instead.  Cook with heart-healthy oils, such as olive, canola, soybean, or sunflower oil. Meal planning    Eat a balanced diet that includes:  5 or more servings of fruits and vegetables each day. At each meal, try to fill half of your plate with fruits and vegetables.  Up to 6-8 servings of whole grains each day.  Less than 6 oz of lean meat, poultry, or fish each day. A 3-oz serving of meat is about the same size as a deck of cards. One egg equals 1 oz.  2 servings of low-fat dairy each day.  A serving of nuts, seeds, or beans 5 times each week.  Heart-healthy fats. Healthy fats called Omega-3 fatty acids are found in foods such as flaxseeds and coldwater fish, like sardines, salmon, and mackerel.  Limit how much you eat of the following:  Canned or prepackaged foods.  Food that is high in trans fat, such as fried foods.  Food that is high in saturated fat, such as fatty meat.  Sweets, desserts, sugary drinks, and other foods with added sugar.  Full-fat dairy products.  Do not salt foods before eating.  Try to eat at least 2  vegetarian meals each week.  Eat more home-cooked food and less restaurant, buffet, and fast food.  When eating at a restaurant, ask that your food be prepared with less salt or no salt, if possible. What foods are recommended? The items listed may not be a complete list. Talk with your dietitian about what dietary choices are best for you. Grains  Whole-grain or whole-wheat bread. Whole-grain or whole-wheat pasta. Brown rice. Orpah Cobb. Bulgur. Whole-grain and low-sodium cereals. Pita bread. Low-fat, low-sodium crackers. Whole-wheat flour tortillas. Vegetables  Fresh or frozen vegetables (raw, steamed, roasted, or grilled). Low-sodium or reduced-sodium tomato and vegetable juice. Low-sodium or reduced-sodium tomato sauce and tomato paste. Low-sodium or reduced-sodium canned vegetables. Fruits  All fresh, dried, or frozen fruit. Canned fruit in natural juice (without added sugar). Meat and other protein foods  Skinless chicken or [place]. Ground chicken or [place]. Pork with fat trimmed off. Fish and seafood. Egg whites. Dried beans, peas, or lentils. Unsalted nuts, nut butters, and seeds. Unsalted canned beans. Lean cuts of beef with fat trimmed off. Low-sodium, lean deli meat. Dairy  Low-fat (1%) or fat-free (skim) milk. Fat-free, low-fat, or reduced-fat cheeses. Nonfat, low-sodium ricotta or cottage cheese. Low-fat or nonfat yogurt. Low-fat, low-sodium cheese. Fats and oils  Soft margarine without trans fats. Vegetable oil. Low-fat, reduced-fat, or light mayonnaise and salad dressings (reduced-sodium). Canola, safflower, olive, soybean, and sunflower oils. Avocado. Seasoning and other foods  Herbs. Spices. Seasoning mixes without salt. Unsalted popcorn and pretzels. Fat-free sweets. What foods are not recommended? The items listed may not be a complete list. Talk with your dietitian about what dietary choices are best for you. Grains  Baked goods made with fat, such as croissants,  muffins, or some breads. Dry pasta or rice meal packs. Vegetables  Creamed or fried vegetables. Vegetables in a cheese sauce. Regular canned vegetables (not low-sodium or reduced-sodium). Regular canned tomato sauce and paste (not low-sodium or reduced-sodium). Regular tomato and vegetable juice (not low-sodium or reduced-sodium). Rosita Fire. Olives. Fruits  Canned fruit in a light or heavy syrup. Fried fruit. Fruit in cream or butter sauce. Meat and other protein foods  Fatty cuts of meat. Ribs. Fried meat. Tomasa Blase. Sausage. Bologna and other processed lunch meats. Salami. Fatback. Hotdogs. Bratwurst. Salted nuts and seeds. Canned beans with added salt. Canned or smoked fish. Whole eggs or egg yolks. Chicken or [place] with skin. Dairy  Whole or 2% milk, cream, and half-and-half. Whole or full-fat cream cheese. Whole-fat or sweetened yogurt. Full-fat cheese. Nondairy creamers. Whipped toppings. Processed cheese and cheese spreads. Fats and oils  Butter. Stick margarine. Lard. Shortening. Ghee. Bacon fat. Tropical oils, such as coconut, palm kernel, or palm oil. Seasoning and other foods  Salted popcorn and pretzels. Onion salt, garlic salt, seasoned salt, table salt, and sea salt. Worcestershire sauce. Tartar sauce. Barbecue sauce. Teriyaki sauce. Soy sauce, including reduced-sodium. Steak sauce. Canned and packaged gravies. Fish sauce. Oyster sauce. Cocktail sauce. Horseradish that you find on the shelf. Ketchup. Mustard. Meat flavorings and tenderizers. Bouillon cubes. Hot sauce and Tabasco sauce. Premade or packaged marinades. Premade or packaged taco seasonings. Relishes. Regular salad dressings. Where to find more information:  National Heart, Lung, and Blood Institute: PopSteam.is  American Heart Association: www.heart.org Summary  The DASH eating plan is a healthy eating plan that has been shown to reduce high blood pressure (hypertension). It may also reduce your risk for type 2  diabetes, heart disease, and stroke.  With the DASH eating plan, you should limit salt (sodium) intake to 2,300 mg a day. If you have hypertension, you may need to reduce your sodium intake to 1,500 mg a day.  When on the DASH eating plan, aim to eat more fresh fruits and vegetables, whole grains, lean proteins, low-fat dairy, and heart-healthy fats.  Work with your health care provider or diet and nutrition specialist (dietitian) to adjust your eating plan to your individual calorie needs. This information is not intended to replace advice given to you by your health care provider. Make sure you discuss any questions you have with your health care provider. Document Released: 02/20/2011 Document Revised: 02/25/2016 Document Reviewed: 02/25/2016 Elsevier Interactive Patient Education  2017 [employer].    If you need a refill on your cardiac medications before your next appointment, please call your pharmacy.      Elson Clan, PA-C  07/08/2016 10:44 AM    [facility] [address] [place], [place], [place]  [address] Phone: [phone]; Fax: [phone]

## 2016-07-09 DIAGNOSIS — I10 Essential (primary) hypertension: Secondary | ICD-10-CM | POA: Diagnosis not present

## 2016-07-09 DIAGNOSIS — M109 Gout, unspecified: Secondary | ICD-10-CM | POA: Diagnosis not present

## 2016-07-09 DIAGNOSIS — I420 Dilated cardiomyopathy: Secondary | ICD-10-CM | POA: Diagnosis not present

## 2016-07-09 DIAGNOSIS — G4733 Obstructive sleep apnea (adult) (pediatric): Secondary | ICD-10-CM | POA: Diagnosis not present

## 2016-07-09 DIAGNOSIS — E782 Mixed hyperlipidemia: Secondary | ICD-10-CM | POA: Diagnosis not present

## 2016-07-09 DIAGNOSIS — E119 Type 2 diabetes mellitus without complications: Secondary | ICD-10-CM | POA: Diagnosis not present

## 2016-07-11 DIAGNOSIS — M109 Gout, unspecified: Secondary | ICD-10-CM | POA: Diagnosis not present

## 2016-07-11 DIAGNOSIS — I420 Dilated cardiomyopathy: Secondary | ICD-10-CM | POA: Diagnosis not present

## 2016-07-11 DIAGNOSIS — E782 Mixed hyperlipidemia: Secondary | ICD-10-CM | POA: Diagnosis not present

## 2016-07-11 DIAGNOSIS — I10 Essential (primary) hypertension: Secondary | ICD-10-CM | POA: Diagnosis not present

## 2016-07-11 DIAGNOSIS — E119 Type 2 diabetes mellitus without complications: Secondary | ICD-10-CM | POA: Diagnosis not present

## 2016-07-11 DIAGNOSIS — G4733 Obstructive sleep apnea (adult) (pediatric): Secondary | ICD-10-CM | POA: Diagnosis not present

## 2016-07-25 DIAGNOSIS — I10 Essential (primary) hypertension: Secondary | ICD-10-CM | POA: Diagnosis not present

## 2016-07-25 DIAGNOSIS — E119 Type 2 diabetes mellitus without complications: Secondary | ICD-10-CM | POA: Diagnosis not present

## 2016-07-25 DIAGNOSIS — E782 Mixed hyperlipidemia: Secondary | ICD-10-CM | POA: Diagnosis not present

## 2016-07-25 DIAGNOSIS — M109 Gout, unspecified: Secondary | ICD-10-CM | POA: Diagnosis not present

## 2016-07-25 DIAGNOSIS — I420 Dilated cardiomyopathy: Secondary | ICD-10-CM | POA: Diagnosis not present

## 2016-07-25 DIAGNOSIS — G4733 Obstructive sleep apnea (adult) (pediatric): Secondary | ICD-10-CM | POA: Diagnosis not present

## 2016-07-29 DIAGNOSIS — G4733 Obstructive sleep apnea (adult) (pediatric): Secondary | ICD-10-CM | POA: Diagnosis not present

## 2016-07-29 DIAGNOSIS — E782 Mixed hyperlipidemia: Secondary | ICD-10-CM | POA: Diagnosis not present

## 2016-07-29 DIAGNOSIS — E119 Type 2 diabetes mellitus without complications: Secondary | ICD-10-CM | POA: Diagnosis not present

## 2016-07-29 DIAGNOSIS — I420 Dilated cardiomyopathy: Secondary | ICD-10-CM | POA: Diagnosis not present

## 2016-07-29 DIAGNOSIS — I10 Essential (primary) hypertension: Secondary | ICD-10-CM | POA: Diagnosis not present

## 2016-07-29 DIAGNOSIS — M109 Gout, unspecified: Secondary | ICD-10-CM | POA: Diagnosis not present

## 2016-08-05 DIAGNOSIS — M109 Gout, unspecified: Secondary | ICD-10-CM | POA: Diagnosis not present

## 2016-08-05 DIAGNOSIS — I10 Essential (primary) hypertension: Secondary | ICD-10-CM | POA: Diagnosis not present

## 2016-08-05 DIAGNOSIS — E119 Type 2 diabetes mellitus without complications: Secondary | ICD-10-CM | POA: Diagnosis not present

## 2016-08-05 DIAGNOSIS — I420 Dilated cardiomyopathy: Secondary | ICD-10-CM | POA: Diagnosis not present

## 2016-08-05 DIAGNOSIS — G4733 Obstructive sleep apnea (adult) (pediatric): Secondary | ICD-10-CM | POA: Diagnosis not present

## 2016-08-05 DIAGNOSIS — E782 Mixed hyperlipidemia: Secondary | ICD-10-CM | POA: Diagnosis not present

## 2016-08-08 DIAGNOSIS — I10 Essential (primary) hypertension: Secondary | ICD-10-CM | POA: Diagnosis not present

## 2016-08-08 DIAGNOSIS — E782 Mixed hyperlipidemia: Secondary | ICD-10-CM | POA: Diagnosis not present

## 2016-08-08 DIAGNOSIS — E119 Type 2 diabetes mellitus without complications: Secondary | ICD-10-CM | POA: Diagnosis not present

## 2016-08-08 DIAGNOSIS — M109 Gout, unspecified: Secondary | ICD-10-CM | POA: Diagnosis not present

## 2016-08-08 DIAGNOSIS — G4733 Obstructive sleep apnea (adult) (pediatric): Secondary | ICD-10-CM | POA: Diagnosis not present

## 2016-08-08 DIAGNOSIS — I420 Dilated cardiomyopathy: Secondary | ICD-10-CM | POA: Diagnosis not present

## 2016-08-12 DIAGNOSIS — E782 Mixed hyperlipidemia: Secondary | ICD-10-CM | POA: Diagnosis not present

## 2016-08-12 DIAGNOSIS — I420 Dilated cardiomyopathy: Secondary | ICD-10-CM | POA: Diagnosis not present

## 2016-08-12 DIAGNOSIS — G4733 Obstructive sleep apnea (adult) (pediatric): Secondary | ICD-10-CM | POA: Diagnosis not present

## 2016-08-12 DIAGNOSIS — M109 Gout, unspecified: Secondary | ICD-10-CM | POA: Diagnosis not present

## 2016-08-12 DIAGNOSIS — E119 Type 2 diabetes mellitus without complications: Secondary | ICD-10-CM | POA: Diagnosis not present

## 2016-08-12 DIAGNOSIS — I10 Essential (primary) hypertension: Secondary | ICD-10-CM | POA: Diagnosis not present

## 2016-08-14 DIAGNOSIS — I10 Essential (primary) hypertension: Secondary | ICD-10-CM | POA: Diagnosis not present

## 2016-08-14 DIAGNOSIS — I420 Dilated cardiomyopathy: Secondary | ICD-10-CM | POA: Diagnosis not present

## 2016-08-14 DIAGNOSIS — G4733 Obstructive sleep apnea (adult) (pediatric): Secondary | ICD-10-CM | POA: Diagnosis not present

## 2016-08-14 DIAGNOSIS — M109 Gout, unspecified: Secondary | ICD-10-CM | POA: Diagnosis not present

## 2016-08-14 DIAGNOSIS — E119 Type 2 diabetes mellitus without complications: Secondary | ICD-10-CM | POA: Diagnosis not present

## 2016-08-14 DIAGNOSIS — E782 Mixed hyperlipidemia: Secondary | ICD-10-CM | POA: Diagnosis not present

## 2016-09-05 ENCOUNTER — Other Ambulatory Visit: Payer: Self-pay | Admitting: Cardiology

## 2016-09-05 DIAGNOSIS — E1169 Type 2 diabetes mellitus with other specified complication: Secondary | ICD-10-CM

## 2016-09-05 DIAGNOSIS — E785 Hyperlipidemia, unspecified: Secondary | ICD-10-CM

## 2016-09-05 DIAGNOSIS — I420 Dilated cardiomyopathy: Secondary | ICD-10-CM

## 2016-09-05 DIAGNOSIS — I10 Essential (primary) hypertension: Secondary | ICD-10-CM

## 2016-10-19 NOTE — Progress Notes (Deleted)
Cardiology Office Note  Date:  10/19/2016   ID:  Jennifer, Kaufman [birth year], MRN [id number]  PCP:  Olive Bass, MD  Cardiologist:  Dr. Delton See    Chief complain: LE edema, DOE.   History of Present Illness: Jennifer Kaufman is a 71 y.o. female who presents for post hospitalization for acute systolic and diastolic HF with EF 20-25%. Admit 06/22/15    D/C'd4/12/17  Past medical history of HTN, HLD, DM and gout however no past cardiac history prior to this admit.  She was diuresed 2 L  -allergic to lisinopril on ARB.  Not much change in wt.  Cr 1.25.   She developed generalized joint and muscle pain after starting atorvastatin, we switched to pravastatin that she is tolerating well. Today she states that she feels really good, she is able to do most activities of daily living, she denies any lower extremity edema orthopnea or paroxysmal nocturnal dyspnea no chest pain or dyspnea on exertion.  and she discontinued however her primary care physician instructed her to restart again. She says that her weight has been stable she has been tolerating her medications well and overall she feels much better since she was discharged from the hospital. She denies any orthopnea paroxysmal nocturnal dyspnea no palpitations or syncope. She has been compliant to her medicines.   06/20/2016 - 6 months follow-up, the patient is coming complaining of increasing weight, worsening lower extremity edema to the point she was not able to put her shoe on. She denies any chest pain, but has noticed worsening dyspnea on exertion. She denies any paroxysmal nocturnal dyspnea, but has two-pillow orthopnea. Denies any palpitations or syncope. She has been compliant with meds.  10/20/16 - 4 months follow up, at the last visit the patient has gained 9 lbs and Lasix was increased to 40 mg daily and spironolactone to 25 mg po daily. She saw Herma Carson on 07/08/16 with weight back to normal and improvement of symptoms. Crea on  07/08/16 1.1.    Past Medical History:  Diagnosis Date  . Anemia   . Arthritis    "all over"  . CHF (congestive heart failure) (HCC) dx'd 06/22/2015  . Gout   . High cholesterol   . History of blood transfusion    'w/hysterectomy"  . Hypertension   . Nonischemic cardiomyopathy (HCC)    a. echo 06/22/15 EF of 20-25% with akinesis of the anterior wall b. R & L cath 06/25/15 -nonobstructive RCA (65%) lesion by FFR, Elevated LVEDP --> medical managment   . Type II diabetes mellitus (HCC)     Past Surgical History:  Procedure Laterality Date  . ABDOMINAL HYSTERECTOMY    . CARDIAC CATHETERIZATION N/A 06/25/2015   Procedure: Right/Left Heart Cath and Coronary Angiography;  Surgeon: Marykay Lex, MD;  Location: [facility] INVASIVE CV LAB;  Service: Cardiovascular;  Laterality: N/A;  . CARPAL TUNNEL RELEASE Bilateral   . JOINT REPLACEMENT    . MENISCUS REPAIR Left    "scoped"  . THYROID LOBECTOMY    . TOTAL HIP ARTHROPLASTY Left 2000  . TUBAL LIGATION       Current Outpatient Prescriptions  Medication Sig Dispense Refill  . aspirin EC 81 MG EC tablet Take 1 tablet (81 mg total) by mouth daily.    . furosemide (LASIX) 20 MG tablet Take 2 tablets (40 mg total) by mouth daily. 180 tablet 3  . metoprolol tartrate (LOPRESSOR) 25 MG tablet Take 1 tablet (25 mg total) by  mouth 2 (two) times daily. 60 tablet 9  . nitroGLYCERIN (NITROSTAT) 0.4 MG SL tablet DISSOLVE 1 TABLET UNDER THE TONGUE EVERY 5 MINUTES AS NEEDED FOR CHEST PAIN 75 tablet 6  . potassium chloride (K-DUR,KLOR-CON) 10 MEQ tablet Take 1 tablet (10 mEq total) by mouth daily. 90 tablet 3  . pravastatin (PRAVACHOL) 20 MG tablet Take 20 mg by mouth daily.    . pravastatin (PRAVACHOL) 20 MG tablet Take 1 tablet (20 mg total) by mouth every evening. 90 tablet 3  . spironolactone (ALDACTONE) 25 MG tablet Take 1 tablet (25 mg total) by mouth daily. 90 tablet 3  . ULORIC 40 MG tablet Take 40 mg by mouth daily.   11  . valsartan (DIOVAN) 320 MG  tablet Take 1 tablet (320 mg total) by mouth daily. 90 tablet 3   No current facility-administered medications for this visit.     Allergies:   Atorvastatin; Carvedilol; Hydrochlorothiazide; and Lisinopril    Social History:  The patient  reports that she has never smoked. Her smokeless tobacco use includes Snuff. She reports that she does not drink alcohol or use drugs.   Family History:  The patient's family history includes Hypertension in her mother.    ROS:  General:no colds or fevers, no weight changes Skin:no rashes or ulcers HEENT:no blurred vision, no congestion CV:see HPI PUL:see HPI GI:no diarrhea constipation or melena, no indigestion GU:no hematuria, no dysuria MS:no joint pain, no claudication Neuro:no syncope, no lightheadedness Endo:+ diabetes, no thyroid disease  Wt Readings from Last 3 Encounters:  07/08/16 192 lb (87.1 kg)  06/20/16 192 lb (87.1 kg)  12/07/15 184 lb (83.5 kg)     PHYSICAL EXAM: VS:  There were no vitals taken for this visit. , BMI There is no height or weight on file to calculate BMI. General:Pleasant affect, NAD Skin:Warm and dry, brisk capillary refill HEENT:normocephalic, sclera clear, mucus membranes moist Neck:supple, no JVD, no bruits  Heart:S1S2 RRR without murmur, gallup, rub or click Lungs: crackles at both bases, rhonchi, or wheezes ZOX:WRUE, non tender, + BS, do not palpate liver spleen or masses Ext: +2 LE edema, 2+ pedal pulses, 2+ radial pulses Neuro:alert and oriented X 3, MAE, follows commands, + facial symmetry  EKG:  EKG is ordered today. The ekg ordered today demonstrates SR with LAD and LVH no acute changes from the hospital.  .    Recent Labs: 01/21/2016: Brain Natriuretic Peptide 123.7 06/20/2016: ALT 9; Hemoglobin 12.7; NT-Pro BNP 206; Platelets 252; TSH 4.750 07/08/2016: BUN 20; Creatinine, Ser 1.11; Potassium 3.8; Sodium 142   Lipid Panel    Component Value Date/Time   CHOL 132 06/20/2016 0852   TRIG 152  (H) 06/20/2016 0852   HDL 35 (L) 06/20/2016 0852   CHOLHDL 3.8 06/20/2016 0852   CHOLHDL 3.6 06/23/2015 0336   VLDL 23 06/23/2015 0336   LDLCALC 67 06/20/2016 0852    Other studies Reviewed: Additional studies/ records that were reviewed today include: . Echo 06/22/15 - Left ventricle: The cavity size was normal. Wall thickness was  increased in a pattern of mild LVH. Systolic function was  severely reduced. The estimated ejection fraction was in the  range of 20% to 25%. Diffuse hypokinesis. There is akinesis of  the anteroseptal and apical myocardium. Doppler parameters are  consistent with restrictive physiology, indicative of decreased  left ventricular diastolic compliance and/or increased left  atrial pressure. - Aortic valve: There was mild regurgitation. - Mitral valve: There was mild regurgitation. - Pericardium,  extracardiac: A small pericardial effusion was  identified. There was a left pleural effusion.  Impressions:  - Diffuse hypokinesis with akinesis of the anteroseptal wall and  apex; overall severely reduced LV function; restrictive filling;  mild AI and MR; small pericardial effusion with mild RA collapse. _____________    Right/Left Heart Cath and Coronary Angiography 06/25/15 1.  Mid RCA lesion, 65% stenosed. FFR non-physiologically significant 2. No severe cardiomyopathy, likely nonischemic 3. Elevated LVEDP is consistent with Pulmonary Capillary Wedge Pressure monitoring.  Nonobstructive RCA lesion by FFR Nonischemic Cardiomyopathy.  Plan:  Return to nursing unit after sheath removal.  Continue IV diuresis and optimization of medical management.  Would also treat for noneffective CAD with statin and aspirin.      TTE: 10/10/2015 Left ventricle: The cavity size was mildly dilated. Wall   thickness was increased in a pattern of moderate LVH. Systolic   function was normal. The estimated ejection fraction was in the   range of 50%  to 55%. Left ventricular diastolic function   parameters were normal. - Aortic valve: There was mild regurgitation. - Mitral valve: There was mild regurgitation. - Atrial septum: No defect or patent foramen ovale was identified.  EKG performed today 06/20/2016 was personally reviewed, and it shows normal sinus rhythm with left axis deviation, when compared to the prior EKG negative T waves in inferolateral leads have resolved.    ASSESSMENT AND PLAN:  1.  Acute combined systolic and diastolic heart failure (HCC)  LVEF 20-25% in April increased to 50-55% in July 2017.  The patient is coming with another episode she has gained 9 pounds in the last 6 months and has lower extremity edema and crackles lung exam. We will increase Lasix to 40 mg daily, spironolactone 25 mg daily, her creatinine was improved from 1.6-->1.3 in September 2017, we will recheck CMP, CBC, TSH, BNP today.  2. Congestive dilated cardiomyopathy (HCC) - as above also continue metoprolol and valsartan..   3. Essential hypertension - uncontrolled. We are increasing her diuretics, will recheck at the next visit.  4. Hyperlipidemia associated with type 2 diabetes mellitus (HCC) - on atorvastatin 80 mg daily we will continue.  5. Diabetes mellitus type 2 followed by PCP  6. Non obstructive CAD. On aspirin, metoprolol, valsartan and atorvastatin.   Follow-up in 2 weeks.   Signed, Tobias Alexander, MD  10/19/2016 10:36 PM    [facility] [address] [place], [place], [place]  [address]/ [address] [place], [place] Phone: [phone]; Fax: [phone]  [phone]

## 2016-10-20 ENCOUNTER — Ambulatory Visit: Payer: Medicare Other | Admitting: Cardiology

## 2016-10-23 ENCOUNTER — Ambulatory Visit (INDEPENDENT_AMBULATORY_CARE_PROVIDER_SITE_OTHER): Payer: Medicare Other | Admitting: Cardiology

## 2016-10-23 ENCOUNTER — Encounter: Payer: Self-pay | Admitting: Cardiology

## 2016-10-23 ENCOUNTER — Other Ambulatory Visit: Payer: Self-pay | Admitting: *Deleted

## 2016-10-23 VITALS — BP 132/80 | HR 63 | Ht 62.0 in | Wt 194.0 lb

## 2016-10-23 DIAGNOSIS — E785 Hyperlipidemia, unspecified: Secondary | ICD-10-CM

## 2016-10-23 DIAGNOSIS — I5022 Chronic systolic (congestive) heart failure: Secondary | ICD-10-CM | POA: Diagnosis not present

## 2016-10-23 DIAGNOSIS — I2510 Atherosclerotic heart disease of native coronary artery without angina pectoris: Secondary | ICD-10-CM | POA: Diagnosis not present

## 2016-10-23 DIAGNOSIS — E1169 Type 2 diabetes mellitus with other specified complication: Secondary | ICD-10-CM | POA: Diagnosis not present

## 2016-10-23 DIAGNOSIS — I5043 Acute on chronic combined systolic (congestive) and diastolic (congestive) heart failure: Secondary | ICD-10-CM

## 2016-10-23 DIAGNOSIS — I10 Essential (primary) hypertension: Secondary | ICD-10-CM

## 2016-10-23 DIAGNOSIS — I420 Dilated cardiomyopathy: Secondary | ICD-10-CM | POA: Diagnosis not present

## 2016-10-23 NOTE — Patient Instructions (Signed)
Medication Instructions:  Your physician recommends that you continue on your current medications as directed. Please refer to the Current Medication list given to you today.   Labwork: Your physician recommends that you return for FASTING lab work a couple days before your appointment with Dr. Delton See in 6 months.   Testing/Procedures: None  Follow-Up: Your physician wants you to follow-up in: 6 months with Dr. Delton See. You will receive a reminder letter in the mail two months in advance. If you don't receive a letter, please call our office to schedule the follow-up appointment.   Any Other Special Instructions Will Be Listed Below (If Applicable).     If you need a refill on your cardiac medications before your next appointment, please call your pharmacy.

## 2016-10-23 NOTE — Progress Notes (Signed)
Cardiology Office Note  Date:  10/23/2016   ID:  Jennifer Kaufman Mar 15, 1946, MRN [id number]  PCP:  Jennifer Bass, MD  Cardiologist:  Dr. Delton See    Chief complain: LE edema, DOE.   History of Present Illness: Jennifer Kaufman is a 71 y.o. female who presents for post hospitalization for acute systolic and diastolic HF with EF 20-25%. Admit 06/22/15    D/C'd4/12/17  Past medical history of HTN, HLD, DM and gout however no past cardiac history prior to this admit.  She was diuresed 2 L  -allergic to lisinopril on ARB.  Not much change in wt.  Cr 1.25.   She developed generalized joint and muscle pain after starting atorvastatin, we switched to pravastatin that she is tolerating well. Today she states that she feels really good, she is able to do most activities of daily living, she denies any lower extremity edema orthopnea or paroxysmal nocturnal dyspnea no chest pain or dyspnea on exertion.  and she discontinued however her primary care physician instructed her to restart again. She says that her weight has been stable she has been tolerating her medications well and overall she feels much better since she was discharged from the hospital. She denies any orthopnea paroxysmal nocturnal dyspnea no palpitations or syncope. She has been compliant to her medicines.   06/20/2016 - 6 months follow-up, the patient is coming complaining of increasing weight, worsening lower extremity edema to the point she was not able to put her shoe on. She denies any chest pain, but has noticed worsening dyspnea on exertion. She denies any paroxysmal nocturnal dyspnea, but has two-pillow orthopnea. Denies any palpitations or syncope. She has been compliant with meds.  10/23/16 - 4 months follow up, at the last visit the patient has gained 9 lbs and Lasix was increased to 40 mg daily and spironolactone to 25 mg po daily. She saw Herma Carson on 07/08/16 with weight back to normal and improvement of symptoms. Crea on  07/08/16 1.1. The patient feels great, she is on and off lower extremity edema, she is trying to stick to low sodium diet, currently no lower extremity edema she is compliant with her medication no side effects with statins. No chest pain and stable dyspnea on exertion. When she will for the mall she has to take breaks but she is able to do all activities of daily living. No palpitations no orthopnea or proximal nocturnal dyspnea.   Past Medical History:  Diagnosis Date  . Anemia   . Arthritis    "all over"  . CHF (congestive heart failure) (HCC) dx'd 06/22/2015  . Gout   . High cholesterol   . History of blood transfusion    'w/hysterectomy"  . Hypertension   . Nonischemic cardiomyopathy (HCC)    a. echo 06/22/15 EF of 20-25% with akinesis of the anterior wall b. R & L cath 06/25/15 -nonobstructive RCA (65%) lesion by FFR, Elevated LVEDP --> medical managment   . Type II diabetes mellitus (HCC)     Past Surgical History:  Procedure Laterality Date  . ABDOMINAL HYSTERECTOMY    . CARDIAC CATHETERIZATION N/A 06/25/2015   Procedure: Right/Left Heart Cath and Coronary Angiography;  Surgeon: Marykay Lex, MD;  Location: [facility] INVASIVE CV LAB;  Service: Cardiovascular;  Laterality: N/A;  . CARPAL TUNNEL RELEASE Bilateral   . JOINT REPLACEMENT    . MENISCUS REPAIR Left    "scoped"  . THYROID LOBECTOMY    . TOTAL HIP  ARTHROPLASTY Left 2000  . TUBAL LIGATION       Current Outpatient Prescriptions  Medication Sig Dispense Refill  . aspirin EC 81 MG EC tablet Take 1 tablet (81 mg total) by mouth daily.    . furosemide (LASIX) 20 MG tablet Take 2 tablets (40 mg total) by mouth daily. 180 tablet 3  . irbesartan (AVAPRO) 300 MG tablet Take 300 mg by mouth at bedtime.    . metoprolol tartrate (LOPRESSOR) 25 MG tablet Take 1 tablet (25 mg total) by mouth 2 (two) times daily. 60 tablet 9  . nitroGLYCERIN (NITROSTAT) 0.4 MG SL tablet DISSOLVE 1 TABLET UNDER THE TONGUE EVERY 5 MINUTES AS NEEDED FOR  CHEST PAIN 75 tablet 6  . potassium chloride (K-DUR,KLOR-CON) 10 MEQ tablet Take 1 tablet (10 mEq total) by mouth daily. 90 tablet 3  . pravastatin (PRAVACHOL) 20 MG tablet Take 1 tablet (20 mg total) by mouth every evening. 90 tablet 3  . ULORIC 40 MG tablet Take 40 mg by mouth daily.   11  . spironolactone (ALDACTONE) 25 MG tablet Take 1 tablet (25 mg total) by mouth daily. 90 tablet 3   No current facility-administered medications for this visit.     Allergies:   Atorvastatin; Carvedilol; Hydrochlorothiazide; and Lisinopril    Social History:  The patient  reports that she has never smoked. Her smokeless tobacco use includes Snuff. She reports that she does not drink alcohol or use drugs.   Family History:  The patient's family history includes Hypertension in her mother.    ROS:  General:no colds or fevers, no weight changes Skin:no rashes or ulcers HEENT:no blurred vision, no congestion CV:see HPI PUL:see HPI GI:no diarrhea constipation or melena, no indigestion GU:no hematuria, no dysuria MS:no joint pain, no claudication Neuro:no syncope, no lightheadedness Endo:+ diabetes, no thyroid disease  Wt Readings from Last 3 Encounters:  10/23/16 194 lb (88 kg)  07/08/16 192 lb (87.1 kg)  06/20/16 192 lb (87.1 kg)     PHYSICAL EXAM: VS:  BP 132/80   Pulse 63   Ht 5\' 2"  (1.575 m)   Wt 194 lb (88 kg)   SpO2 97%   BMI 35.48 kg/m  , BMI Body mass index is 35.48 kg/m. General:Pleasant affect, NAD Skin:Warm and dry, brisk capillary refill HEENT:normocephalic, sclera clear, mucus membranes moist Neck:supple, no JVD, no bruits  Heart:S1S2 RRR without murmur, gallup, rub or click Lungs: crackles at both bases, rhonchi, or wheezes ZOX:WRUE, non tender, + BS, do not palpate liver spleen or masses Ext: +2 LE edema, 2+ pedal pulses, 2+ radial pulses Neuro:alert and oriented X 3, MAE, follows commands, + facial symmetry  EKG:  EKG is ordered today. The ekg ordered today  demonstrates SR with LAD and LVH no acute changes from the hospital.  .    Recent Labs: 01/21/2016: Brain Natriuretic Peptide 123.7 06/20/2016: ALT 9; Hemoglobin 12.7; NT-Pro BNP 206; Platelets 252; TSH 4.750 07/08/2016: BUN 20; Creatinine, Ser 1.11; Potassium 3.8; Sodium 142   Lipid Panel    Component Value Date/Time   CHOL 132 06/20/2016 0852   TRIG 152 (H) 06/20/2016 0852   HDL 35 (L) 06/20/2016 0852   CHOLHDL 3.8 06/20/2016 0852   CHOLHDL 3.6 06/23/2015 0336   VLDL 23 06/23/2015 0336   LDLCALC 67 06/20/2016 0852    Other studies Reviewed: Additional studies/ records that were reviewed today include: . Echo 06/22/15 - Left ventricle: The cavity size was normal. Wall thickness was  increased in a  pattern of mild LVH. Systolic function was  severely reduced. The estimated ejection fraction was in the  range of 20% to 25%. Diffuse hypokinesis. There is akinesis of  the anteroseptal and apical myocardium. Doppler parameters are  consistent with restrictive physiology, indicative of decreased  left ventricular diastolic compliance and/or increased left  atrial pressure. - Aortic valve: There was mild regurgitation. - Mitral valve: There was mild regurgitation. - Pericardium, extracardiac: A small pericardial effusion was  identified. There was a left pleural effusion.  Impressions:  - Diffuse hypokinesis with akinesis of the anteroseptal wall and  apex; overall severely reduced LV function; restrictive filling;  mild AI and MR; small pericardial effusion with mild RA collapse. _____________    Right/Left Heart Cath and Coronary Angiography 06/25/15 1.  Mid RCA lesion, 65% stenosed. FFR non-physiologically significant 2. No severe cardiomyopathy, likely nonischemic 3. Elevated LVEDP is consistent with Pulmonary Capillary Wedge Pressure monitoring.  Nonobstructive RCA lesion by FFR Nonischemic Cardiomyopathy.  Plan:  Return to nursing unit after sheath  removal.  Continue IV diuresis and optimization of medical management.  Would also treat for noneffective CAD with statin and aspirin.      TTE: 10/10/2015 Left ventricle: The cavity size was mildly dilated. Wall   thickness was increased in a pattern of moderate LVH. Systolic   function was normal. The estimated ejection fraction was in the   range of 50% to 55%. Left ventricular diastolic function   parameters were normal. - Aortic valve: There was mild regurgitation. - Mitral valve: There was mild regurgitation. - Atrial septum: No defect or patent foramen ovale was identified.  EKG performed today 06/20/2016 was personally reviewed, and it shows normal sinus rhythm with left axis deviation, when compared to the prior EKG negative T waves in inferolateral leads have resolved.    ASSESSMENT AND PLAN:  1.  Acute combined systolic and diastolic heart failure (HCC)  LVEF 20-25% in April increased to 50-55% in July 2017.  She is now euvolemic on stable meds-nARB, spironolactone, metoprolol and low sodium diet Crea 1.1, we will continue the same dose of Lasix 40 mg po daily.  2. Congestive dilated cardiomyopathy (HCC) - as above also continue metoprolol, valsartan, spironolactone.   3. Essential hypertension - uncontrolled. We are increasing her diuretics, will recheck at the next visit.  4. Hyperlipidemia associated with type 2 diabetes mellitus (HCC) - on atorvastatin 80 mg daily we will continue.  5. Diabetes mellitus type 2 followed by PCP  6. Non obstructive CAD. On aspirin, metoprolol, valsartan and atorvastatin.   Follow-up in 6 months. Signed, Tobias Alexander, MD  10/23/2016 10:15 AM    [facility] [address] [place], [place], [place]  [address]/ [address] [place], [place] Phone: [phone]; Fax: [phone]  [phone]

## 2016-11-29 ENCOUNTER — Other Ambulatory Visit: Payer: Self-pay | Admitting: Cardiology

## 2016-12-01 NOTE — Telephone Encounter (Signed)
Medication Detail    Disp Refills Start End   metoprolol tartrate (LOPRESSOR) 25 MG tablet 60 tablet 9 06/20/2016    Sig - Route: Take 1 tablet (25 mg total) by mouth 2 (two) times daily. - Oral   Sent to pharmacy as: metoprolol tartrate (LOPRESSOR) 25 MG tablet   E-Prescribing Status: Receipt confirmed by pharmacy (06/20/2016 8:44 AM EDT)   Associated Diagnoses   Acute on chronic combined systolic and diastolic congestive heart failure (HCC) - Primary     Acute combined systolic and diastolic heart failure (HCC)     Congestive dilated cardiomyopathy (HCC)     Essential hypertension     CAD in native artery     Statin intolerance     Mixed hyperlipidemia     Pharmacy   [id number] - [place], [place] - [address] AT [address]

## 2017-03-11 ENCOUNTER — Telehealth: Payer: Self-pay | Admitting: Cardiology

## 2017-03-11 NOTE — Telephone Encounter (Signed)
New message     Pt c/o medication issue:  1. Name of Medication: pravastatin 20mg  and atovastatin  2. How are you currently taking this medication (dosage and times per day)?   3. Are you having a reaction (difficulty breathing--STAT)? no  4. What is your medication issue? pcp prescribed atovastatin and Dr. Delton See prescribed pravastatin 20 mg. Patient wants to know should she take both?

## 2017-03-11 NOTE — Telephone Encounter (Signed)
Spoke with Gabriel Rung (DPR on file). She states patient was at PCP and noticed patient was taking atorvastatin 80 mg once and pravastatin 20 mg once a day. She was told by MD to refer to cardiologist for clarification on correct dose to take. Atorvastatin was prescribed by her PCP. I informed her that atorvastatin is listed on her allergies list, she is not sure why it is listed. I informed patient that per our record patient should be taking pravastatin 20 mg once a day. Informed her I would send to Dr. Delton See for further recommendations if needed. She verbalized understanding and thanked me for the call.

## 2017-03-12 NOTE — Telephone Encounter (Signed)
Discontinue atorvastatin, continue pravastatin 20 mg po daily

## 2017-03-13 NOTE — Telephone Encounter (Signed)
Atorvastatin has been discontinued and pt is aware to continue taking pravastatin 20 mg po daily.

## 2017-05-12 ENCOUNTER — Encounter (INDEPENDENT_AMBULATORY_CARE_PROVIDER_SITE_OTHER): Payer: Self-pay

## 2017-05-12 ENCOUNTER — Other Ambulatory Visit: Payer: Medicare Other

## 2017-05-12 DIAGNOSIS — I10 Essential (primary) hypertension: Secondary | ICD-10-CM

## 2017-05-12 DIAGNOSIS — I5022 Chronic systolic (congestive) heart failure: Secondary | ICD-10-CM

## 2017-05-13 ENCOUNTER — Telehealth: Payer: Self-pay | Admitting: *Deleted

## 2017-05-13 DIAGNOSIS — R7989 Other specified abnormal findings of blood chemistry: Secondary | ICD-10-CM

## 2017-05-13 LAB — BASIC METABOLIC PANEL
BUN/Creatinine Ratio: 15 (ref 12–28)
BUN: 24 mg/dL (ref 8–27)
CO2: 23 mmol/L (ref 20–29)
Calcium: 10 mg/dL (ref 8.7–10.3)
Chloride: 100 mmol/L (ref 96–106)
Creatinine, Ser: 1.63 mg/dL — ABNORMAL HIGH (ref 0.57–1.00)
GFR calc Af Amer: 36 mL/min/{1.73_m2} — ABNORMAL LOW (ref >59)
GFR calc non Af Amer: 31 mL/min/{1.73_m2} — ABNORMAL LOW (ref >59)
Glucose: 138 mg/dL — ABNORMAL HIGH (ref 65–99)
Potassium: 4.4 mmol/L (ref 3.5–5.2)
Sodium: 140 mmol/L (ref 134–144)

## 2017-05-13 LAB — CBC WITH DIFFERENTIAL/PLATELET
Basophils Absolute: 0 10*3/uL (ref 0.0–0.2)
Basos: 1 %
EOS (ABSOLUTE): 0.3 10*3/uL (ref 0.0–0.4)
Eos: 5 %
Hematocrit: 31.8 % — ABNORMAL LOW (ref 34.0–46.6)
Hemoglobin: 12 g/dL (ref 11.1–15.9)
Immature Grans (Abs): 0 10*3/uL (ref 0.0–0.1)
Immature Granulocytes: 0 %
Lymphocytes Absolute: 2 10*3/uL (ref 0.7–3.1)
Lymphs: 41 %
MCH: 39.5 pg — ABNORMAL HIGH (ref 26.6–33.0)
MCHC: 37.7 g/dL — ABNORMAL HIGH (ref 31.5–35.7)
MCV: 105 fL — ABNORMAL HIGH (ref 79–97)
Monocytes Absolute: 0.3 10*3/uL (ref 0.1–0.9)
Monocytes: 5 %
Neutrophils Absolute: 2.4 10*3/uL (ref 1.4–7.0)
Neutrophils: 48 %
Platelets: 244 10*3/uL (ref 150–379)
RBC: 3.04 x10E6/uL — ABNORMAL LOW (ref 3.77–5.28)
RDW: 16 % — ABNORMAL HIGH (ref 12.3–15.4)
WBC: 4.9 10*3/uL (ref 3.4–10.8)

## 2017-05-13 LAB — LIPID PANEL
Chol/HDL Ratio: 5.3 ratio — ABNORMAL HIGH (ref 0.0–4.4)
Cholesterol, Total: 181 mg/dL (ref 100–199)
HDL: 34 mg/dL — ABNORMAL LOW (ref >39)
LDL Calculated: 115 mg/dL — ABNORMAL HIGH (ref 0–99)
Triglycerides: 159 mg/dL — ABNORMAL HIGH (ref 0–149)
VLDL Cholesterol Cal: 32 mg/dL (ref 5–40)

## 2017-05-13 LAB — HEPATIC FUNCTION PANEL
ALT: 8 IU/L (ref 0–32)
AST: 11 IU/L (ref 0–40)
Albumin: 4.6 g/dL (ref 3.5–4.8)
Alkaline Phosphatase: 124 IU/L — ABNORMAL HIGH (ref 39–117)
Bilirubin Total: 0.7 mg/dL (ref 0.0–1.2)
Bilirubin, Direct: 0.19 mg/dL (ref 0.00–0.40)
Total Protein: 6.7 g/dL (ref 6.0–8.5)

## 2017-05-13 LAB — TSH: TSH: 3.82 u[IU]/mL (ref 0.450–4.500)

## 2017-05-13 NOTE — Telephone Encounter (Signed)
Spoke with the pt and informed her that per Dr Delton See, her labs showed that her creatinine is now worse, and she recommends that she hold both her lasix and her K-Dur, until her 05/19/17 appt.  Informed the pt that per Dr Delton See, her hemoglobin is normal, TSH is normal, but her lipids are elevated, and she wanted to confirm that she is still taking her pravastatin.   Pt states she IS TAKING her Pravastatin, everyday.  Informed the pt that I will make Dr Delton See aware of this, and follow back up with her if she has any further recommendations.  Scheduled the pt a repeat BMET for 05/19/17, same day as she is seen in the clinic, to recheck her kidney function.  HOLD note placed on both lasix and KDUR orders in pts med list.  Pt verbalized understanding and agrees with this plan.

## 2017-05-13 NOTE — Telephone Encounter (Signed)
-----   Message from Lars Masson, MD sent at 05/13/2017  8:55 AM EST ----- She has appointment on March 5, however her creatinine is now worse, I would hold Lasix and potassium supplements to the next visit on March 5, hemoglobin is normal, TSH is normal, lipids are elevated, please ask if she is taking statins. She will have to have her labs rechecked at the next visit.

## 2017-05-15 ENCOUNTER — Telehealth: Payer: Self-pay | Admitting: *Deleted

## 2017-05-15 MED ORDER — PRAVASTATIN SODIUM 40 MG PO TABS: 40.0000 mg | ORAL_TABLET | Freq: Every evening | ORAL | 1 refills | Status: DC

## 2017-05-15 NOTE — Telephone Encounter (Signed)
-----   Message from Lars Masson, MD sent at 05/15/2017  8:30 AM EST ----- Please increase pravastatin to 40 mg po daily and see if she tolerates it

## 2017-05-15 NOTE — Telephone Encounter (Signed)
Spoke with the pt and informed her that being her lipids are still elevated and she is taking her pravastatin 20 mg daily, Dr Delton See now wants to increase her Pravastatin to 40 mg po daily.  Confirmed the pharmacy of choice with the pt.  Pt verbalized understanding and agrees with this plan.

## 2017-05-19 ENCOUNTER — Encounter: Payer: Self-pay | Admitting: Physician Assistant

## 2017-05-19 ENCOUNTER — Other Ambulatory Visit: Payer: Medicare Other

## 2017-05-19 ENCOUNTER — Ambulatory Visit (INDEPENDENT_AMBULATORY_CARE_PROVIDER_SITE_OTHER): Payer: Medicare Other | Admitting: Physician Assistant

## 2017-05-19 VITALS — BP 150/70 | HR 70 | Ht 62.0 in | Wt 194.0 lb

## 2017-05-19 DIAGNOSIS — I2510 Atherosclerotic heart disease of native coronary artery without angina pectoris: Secondary | ICD-10-CM

## 2017-05-19 DIAGNOSIS — E1169 Type 2 diabetes mellitus with other specified complication: Secondary | ICD-10-CM | POA: Diagnosis not present

## 2017-05-19 DIAGNOSIS — R7989 Other specified abnormal findings of blood chemistry: Secondary | ICD-10-CM

## 2017-05-19 DIAGNOSIS — E785 Hyperlipidemia, unspecified: Secondary | ICD-10-CM | POA: Diagnosis not present

## 2017-05-19 DIAGNOSIS — I420 Dilated cardiomyopathy: Secondary | ICD-10-CM | POA: Diagnosis not present

## 2017-05-19 DIAGNOSIS — I10 Essential (primary) hypertension: Secondary | ICD-10-CM | POA: Diagnosis not present

## 2017-05-19 LAB — BASIC METABOLIC PANEL
BUN/Creatinine Ratio: 16 (ref 12–28)
BUN: 20 mg/dL (ref 8–27)
CO2: 24 mmol/L (ref 20–29)
Calcium: 9.7 mg/dL (ref 8.7–10.3)
Chloride: 106 mmol/L (ref 96–106)
Creatinine, Ser: 1.28 mg/dL — ABNORMAL HIGH (ref 0.57–1.00)
GFR calc Af Amer: 48 mL/min/{1.73_m2} — ABNORMAL LOW (ref >59)
GFR calc non Af Amer: 42 mL/min/{1.73_m2} — ABNORMAL LOW (ref >59)
Glucose: 140 mg/dL — ABNORMAL HIGH (ref 65–99)
Potassium: 4.6 mmol/L (ref 3.5–5.2)
Sodium: 141 mmol/L (ref 134–144)

## 2017-05-19 LAB — TSH: TSH: 3.05 u[IU]/mL (ref 0.450–4.500)

## 2017-05-19 MED ORDER — SPIRONOLACTONE 25 MG PO TABS: 25.0000 mg | ORAL_TABLET | Freq: Two times a day (BID) | ORAL | 3 refills | Status: DC

## 2017-05-19 NOTE — Patient Instructions (Signed)
Medication Instructions:  Your physician has recommended you make the following change in your medication:  1.  INCREASE the Spironolactone to twice a day  Labwork: TODAY:  BMET & TSH  Testing/Procedures: None ordered  Follow-Up: Your physician wants you to follow-up in: 6 MONTHS WITH DR Johnell Comings will receive a reminder letter in the mail two months in advance. If you don't receive a letter, please call our office to schedule the follow-up appointment.   Any Other Special Instructions Will Be Listed Below (If Applicable).     If you need a refill on your cardiac medications before your next appointment, please call your pharmacy.        DASH Eating Plan DASH stands for "Dietary Approaches to Stop Hypertension." The DASH eating plan is a healthy eating plan that has been shown to reduce high blood pressure (hypertension). It may also reduce your risk for type 2 diabetes, heart disease, and stroke. The DASH eating plan may also help with weight loss. What are tips for following this plan? General guidelines  Avoid eating more than 2,300 mg (milligrams) of salt (sodium) a day. If you have hypertension, you may need to reduce your sodium intake to 2,000 mg a day.  Limit alcohol intake to no more than 1 drink a day for nonpregnant women and 2 drinks a day for men. One drink equals 12 oz of beer, 5 oz of wine, or 1 oz of hard liquor.  Work with your health care provider to maintain a healthy body weight or to lose weight. Ask what an ideal weight is for you.  Get at least 30 minutes of exercise that causes your heart to beat faster (aerobic exercise) most days of the week. Activities may include walking, swimming, or biking.  Work with your health care provider or diet and nutrition specialist (dietitian) to adjust your eating plan to your individual calorie needs. Reading food labels  Check food labels for the amount of sodium per serving. Choose foods with less than 5  percent of the Daily Value of sodium. Generally, foods with less than 300 mg of sodium per serving fit into this eating plan.  To find whole grains, look for the word "whole" as the first word in the ingredient list. Shopping  Buy products labeled as "low-sodium" or "no salt added."  Buy fresh foods. Avoid canned foods and premade or frozen meals. Cooking  Avoid adding salt when cooking. Use salt-free seasonings or herbs instead of table salt or sea salt. Check with your health care provider or pharmacist before using salt substitutes.  Do not fry foods. Cook foods using healthy methods such as baking, boiling, grilling, and broiling instead.  Cook with heart-healthy oils, such as olive, canola, soybean, or sunflower oil. Meal planning   Eat a balanced diet that includes: ? 5 or more servings of fruits and vegetables each day. At each meal, try to fill half of your plate with fruits and vegetables. ? Up to 6-8 servings of whole grains each day. ? Less than 6 oz of lean meat, poultry, or fish each day. A 3-oz serving of meat is about the same size as a deck of cards. One egg equals 1 oz. ? 2 servings of low-fat dairy each day. ? A serving of nuts, seeds, or beans 5 times each week. ? Heart-healthy fats. Healthy fats called Omega-3 fatty acids are found in foods such as flaxseeds and coldwater fish, like sardines, salmon, and mackerel.  Limit  how much you eat of the following: ? Canned or prepackaged foods. ? Food that is high in trans fat, such as fried foods. ? Food that is high in saturated fat, such as fatty meat. ? Sweets, desserts, sugary drinks, and other foods with added sugar. ? Full-fat dairy products.  Do not salt foods before eating.  Try to eat at least 2 vegetarian meals each week.  Eat more home-cooked food and less restaurant, buffet, and fast food.  When eating at a restaurant, ask that your food be prepared with less salt or no salt, if possible. What foods are  recommended? The items listed may not be a complete list. Talk with your dietitian about what dietary choices are best for you. Grains Whole-grain or whole-wheat bread. Whole-grain or whole-wheat pasta. Brown rice. [place] Morrow. Bulgur. Whole-grain and low-sodium cereals. Pita bread. Low-fat, low-sodium crackers. Whole-wheat flour tortillas. Vegetables Fresh or frozen vegetables (raw, steamed, roasted, or grilled). Low-sodium or reduced-sodium tomato and vegetable juice. Low-sodium or reduced-sodium tomato sauce and tomato paste. Low-sodium or reduced-sodium canned vegetables. Fruits All fresh, dried, or frozen fruit. Canned fruit in natural juice (without added sugar). Meat and other protein foods Skinless chicken or [place]. Ground chicken or [place]. Pork with fat trimmed off. Fish and seafood. Egg whites. Dried beans, peas, or lentils. Unsalted nuts, nut butters, and seeds. Unsalted canned beans. Lean cuts of beef with fat trimmed off. Low-sodium, lean deli meat. Dairy Low-fat (1%) or fat-free (skim) milk. Fat-free, low-fat, or reduced-fat cheeses. Nonfat, low-sodium ricotta or cottage cheese. Low-fat or nonfat yogurt. Low-fat, low-sodium cheese. Fats and oils Soft margarine without trans fats. Vegetable oil. Low-fat, reduced-fat, or light mayonnaise and salad dressings (reduced-sodium). Canola, safflower, olive, soybean, and sunflower oils. Avocado. Seasoning and other foods Herbs. Spices. Seasoning mixes without salt. Unsalted popcorn and pretzels. Fat-free sweets. What foods are not recommended? The items listed may not be a complete list. Talk with your dietitian about what dietary choices are best for you. Grains Baked goods made with fat, such as croissants, muffins, or some breads. Dry pasta or rice meal packs. Vegetables Creamed or fried vegetables. Vegetables in a cheese sauce. Regular canned vegetables (not low-sodium or reduced-sodium). Regular canned tomato sauce and paste (not  low-sodium or reduced-sodium). Regular tomato and vegetable juice (not low-sodium or reduced-sodium). Angie Fava. Olives. Fruits Canned fruit in a light or heavy syrup. Fried fruit. Fruit in cream or butter sauce. Meat and other protein foods Fatty cuts of meat. Ribs. Fried meat. Berniece Salines. Sausage. Bologna and other processed lunch meats. Salami. Fatback. Hotdogs. Bratwurst. Salted nuts and seeds. Canned beans with added salt. Canned or smoked fish. Whole eggs or egg yolks. Chicken or [place] with skin. Dairy Whole or 2% milk, cream, and half-and-half. Whole or full-fat cream cheese. Whole-fat or sweetened yogurt. Full-fat cheese. Nondairy creamers. Whipped toppings. Processed cheese and cheese spreads. Fats and oils Butter. Stick margarine. Lard. Shortening. Ghee. Bacon fat. Tropical oils, such as coconut, palm kernel, or palm oil. Seasoning and other foods Salted popcorn and pretzels. Onion salt, garlic salt, seasoned salt, table salt, and sea salt. Worcestershire sauce. Tartar sauce. Barbecue sauce. Teriyaki sauce. Soy sauce, including reduced-sodium. Steak sauce. Canned and packaged gravies. Fish sauce. Oyster sauce. Cocktail sauce. Horseradish that you find on the shelf. Ketchup. Mustard. Meat flavorings and tenderizers. Bouillon cubes. Hot sauce and Tabasco sauce. Premade or packaged marinades. Premade or packaged taco seasonings. Relishes. Regular salad dressings. Where to find more information:  National Heart, Lung, and Blood Institute:  https://wilson-eaton.com/  American Heart Association: www.heart.org Summary  The DASH eating plan is a healthy eating plan that has been shown to reduce high blood pressure (hypertension). It may also reduce your risk for type 2 diabetes, heart disease, and stroke.  With the DASH eating plan, you should limit salt (sodium) intake to 2,300 mg a day. If you have hypertension, you may need to reduce your sodium intake to 1,500 mg a day.  When on the DASH eating plan,  aim to eat more fresh fruits and vegetables, whole grains, lean proteins, low-fat dairy, and heart-healthy fats.  Work with your health care provider or diet and nutrition specialist (dietitian) to adjust your eating plan to your individual calorie needs. This information is not intended to replace advice given to you by your health care provider. Make sure you discuss any questions you have with your health care provider. Document Released: 02/20/2011 Document Revised: 02/25/2016 Document Reviewed: 02/25/2016 Elsevier Interactive Patient Education  [employer].

## 2017-05-19 NOTE — Progress Notes (Signed)
Cardiology Office Note    Date:  05/19/2017   ID:  Kaufman, Jennifer [birth year], MRN [id number]  PCP:  Jennifer Bass, MD  Cardiologist: Jennifer Alexander, MD  Chief Complaint  Patient presents with  . Follow-up    History of Present Illness:  Jennifer Kaufman is a 72 y.o. 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  Follow-up echo 06/2015 LVEF 50-55%.  She has nonobstructive CAD with 65% mid RCA FFR non-physiologically significant, on cath 06/2015.  Patient last saw Dr. Delton See 10/2016 and was doing well.   Patient's creatinine went up to 1.63 and Dr. Delton See asked her to stop her Lasix and potassium 05/12/17.  She comes in today accompanied by her son for follow-up.  Since stopping the Lasix she has had no further increase in edema or weight gain.  She denies dyspnea, dyspnea on exertion dizziness or presyncope.  Past Medical History:  Diagnosis Date  . Anemia   . Arthritis    "all over"  . CHF (congestive heart failure) (HCC) dx'd 06/22/2015  . Gout   . High cholesterol   . History of blood transfusion    'w/hysterectomy"  . Hypertension   . Nonischemic cardiomyopathy (HCC)    a. echo 06/22/15 EF of 20-25% with akinesis of the anterior wall b. R & L cath 06/25/15 -nonobstructive RCA (65%) lesion by FFR, Elevated LVEDP --> medical managment   . Type II diabetes mellitus (HCC)     Past Surgical History:  Procedure Laterality Date  . ABDOMINAL HYSTERECTOMY    . CARDIAC CATHETERIZATION N/A 06/25/2015   Procedure: Right/Left Heart Cath and Coronary Angiography;  Surgeon: Jennifer Lex, MD;  Location: [facility] INVASIVE CV LAB;  Service: Cardiovascular;  Laterality: N/A;  . CARPAL TUNNEL RELEASE Bilateral   . JOINT REPLACEMENT    . MENISCUS REPAIR Left    "scoped"  . THYROID LOBECTOMY    . TOTAL HIP ARTHROPLASTY Left 2000  . TUBAL LIGATION      Current Medications: Current Meds  Medication Sig  . aspirin  EC 81 MG EC tablet Take 1 tablet (81 mg total) by mouth daily.  . metoprolol tartrate (LOPRESSOR) 25 MG tablet Take 1 tablet (25 mg total) by mouth 2 (two) times daily.  . nitroGLYCERIN (NITROSTAT) 0.4 MG SL tablet DISSOLVE 1 TABLET UNDER THE TONGUE EVERY 5 MINUTES AS NEEDED FOR CHEST PAIN  . pravastatin (PRAVACHOL) 40 MG tablet Take 1 tablet (40 mg total) by mouth every evening.  Marland Kitchen spironolactone (ALDACTONE) 25 MG tablet Take 1 tablet (25 mg total) by mouth daily.  Marland Kitchen ULORIC 40 MG tablet Take 40 mg by mouth daily.      Allergies:   Atorvastatin; Carvedilol; Hydrochlorothiazide; and Lisinopril   Social History   Socioeconomic History  . Marital status: Widowed    Spouse name: None  . Number of children: None  . Years of education: None  . Highest education level: None  Social Needs  . Financial resource strain: None  . Food insecurity - worry: None  . Food insecurity - inability: None  . Transportation needs - medical: None  . Transportation needs - non-medical: None  Occupational History  . None  Tobacco Use  . Smoking status: Never Smoker  . Smokeless tobacco: Current User    Types: Snuff  Substance and Sexual Activity  . Alcohol use: No  . Drug use: No  . Sexual activity: No  Other Topics Concern  . None  Social History Narrative  . None     Family History:  The patient's family history includes Hypertension in her mother.   ROS:   Please see the history of present illness.    Review of Systems  Constitution: Negative.  HENT: Negative.   Eyes: Negative.   Cardiovascular: Negative.   Respiratory: Negative.   Hematologic/Lymphatic: Negative.   Musculoskeletal: Negative.  Negative for joint pain.  Gastrointestinal: Negative.   Genitourinary: Negative.   Neurological: Negative.    All other systems reviewed and are negative.   PHYSICAL EXAM:   VS:  BP (!) 150/70 (BP Location: Left Arm, Patient Position: Sitting, Cuff Size: Normal)   Pulse 70   Ht 5\' 2"   (1.575 m)   Wt 194 lb (88 kg)   SpO2 97%   BMI 35.48 kg/m   Physical Exam  GEN: Well nourished, well developed, in no acute distress  Neck: no JVD, carotid bruits, or masses Cardiac:RRR; no murmurs, rubs, or gallops  Respiratory:  clear to auscultation bilaterally, normal work of breathing GI: soft, nontender, nondistended, + BS Ext: Trace of ankle edema bilaterally without cyanosis, clubbing, Good distal pulses bilaterally Neuro:  Alert and Oriented x 3 Psych: euthymic mood, full affect  Wt Readings from Last 3 Encounters:  05/19/17 194 lb (88 kg)  10/23/16 194 lb (88 kg)  07/08/16 192 lb (87.1 kg)      Studies/Labs Reviewed:   EKG:  EKG is ordered today.  EKG showed sinus bradycardia at 56 bpm nonspecific ST-T wave changes, no acute change Recent Labs: 06/20/2016: NT-Pro BNP 206 05/12/2017: ALT 8; BUN 24; Creatinine, Ser 1.63; Hemoglobin 12.0; Platelets 244; Potassium 4.4; Sodium 140; TSH 3.820   Lipid Panel    Component Value Date/Time   CHOL 181 05/12/2017 0826   TRIG 159 (H) 05/12/2017 0826   HDL 34 (L) 05/12/2017 0826   CHOLHDL 5.3 (H) 05/12/2017 0826   CHOLHDL 3.6 06/23/2015 0336   VLDL 23 06/23/2015 0336   LDLCALC 115 (H) 05/12/2017 0826    Additional studies/ records that were reviewed today include:  Cardiac cath 4/10/17Conclusion   1. Mid RCA lesion, 65% stenosed. FFR non-physiologically significant 2. No severe cardiomyopathy, likely nonischemic 3. Elevated LVEDP is consistent with Pulmonary Capillary Wedge Pressure monitoring.   Nonobstructive RCA lesion by FFR Nonischemic Cardiomyopathy.   Plan:  Return to nursing unit after sheath removal.  Continue IV diuresis and optimization of medical management.  Would also treat for noneffective CAD with statin and aspirin.     Echo 06/22/15 - Left ventricle: The cavity size was normal. Wall thickness was   increased in a pattern of mild LVH. Systolic function was   severely reduced. The estimated  ejection fraction was in the   range of 20% to 25%. Diffuse hypokinesis. There is akinesis of   the anteroseptal and apical myocardium. Doppler parameters are   consistent with restrictive physiology, indicative of decreased   left ventricular diastolic compliance and/or increased left   atrial pressure. - Aortic valve: There was mild regurgitation. - Mitral valve: There was mild regurgitation. - Pericardium, extracardiac: A small pericardial effusion was   identified. There was a left pleural effusion.   Impressions:   - Diffuse hypokinesis with akinesis of the anteroseptal wall and   apex; overall severely reduced LV function; restrictive filling;   mild AI and MR; small pericardial effusion with mild RA collapse. _____________  TTE: 10/10/2015 Left ventricle: The cavity size was mildly dilated. Wall   thickness was increased in a pattern of moderate LVH. Systolic   function was normal. The estimated ejection fraction was in the   range of 50% to 55%. Left ventricular diastolic function   parameters were normal. - Aortic valve: There was mild regurgitation. - Mitral valve: There was mild regurgitation. - Atrial septum: No defect or patent foramen ovale was identified.    ASSESSMENT:    1. Dilated cardiomyopathy (HCC)   2. Essential hypertension   3. Coronary artery disease involving native coronary artery of native heart without angina pectoris   4. Hyperlipidemia associated with type 2 diabetes mellitus (HCC)   5. Elevated serum creatinine      PLAN:  In order of problems listed above:  Dilated cardiomyopathy last LVEF 50-55% overall doing well without heart failure symptoms.  Follow-up with Dr. Delton See in 6 months.  Essential hypertension blood pressure is up since stopping the Lasix and potassium.  Will increase spironolactone to 25 mg twice daily.  She will check her blood pressures at home.  Nonobstructive CAD cath in 06/2015, 65%  RCA-asymptomatic  Hyperlipidemia LDL 115 05/12/17 up from 67 last April  And pravastatin increased to 40 mg daily  Elevated serum creatinine most recently 1.63 on 05/12/17 and Lasix and potassium held-for repeat labs today.  Will add TSH since it was borderline elevated last year.    Medication Adjustments/Labs and Tests Ordered: Current medicines are reviewed at length with the patient today.  Concerns regarding medicines are outlined above.  Medication changes, Labs and Tests ordered today are listed in the Patient Instructions below. There are no Patient Instructions on file for this visit.   Elson Clan, PA-C  05/19/2017 10:45 AM    [facility] [address] [place], [place], [place]  [address] Phone: [phone]; Fax: [phone]

## 2017-06-30 ENCOUNTER — Other Ambulatory Visit: Payer: Self-pay | Admitting: Cardiology

## 2017-06-30 DIAGNOSIS — I420 Dilated cardiomyopathy: Secondary | ICD-10-CM

## 2017-06-30 DIAGNOSIS — I2510 Atherosclerotic heart disease of native coronary artery without angina pectoris: Secondary | ICD-10-CM

## 2017-06-30 DIAGNOSIS — Z789 Other specified health status: Secondary | ICD-10-CM

## 2017-06-30 DIAGNOSIS — I5041 Acute combined systolic (congestive) and diastolic (congestive) heart failure: Secondary | ICD-10-CM

## 2017-06-30 DIAGNOSIS — E782 Mixed hyperlipidemia: Secondary | ICD-10-CM

## 2017-06-30 DIAGNOSIS — I10 Essential (primary) hypertension: Secondary | ICD-10-CM

## 2017-06-30 DIAGNOSIS — I5043 Acute on chronic combined systolic (congestive) and diastolic (congestive) heart failure: Secondary | ICD-10-CM

## 2017-07-10 IMAGING — CR DG CHEST 2V
2 series · 2 of 2 positions shown · non-contrast
Comparison: 08/01/2011

CLINICAL DATA: Shortness of Breath

EXAM:
CHEST  2 VIEW

[chest pa]
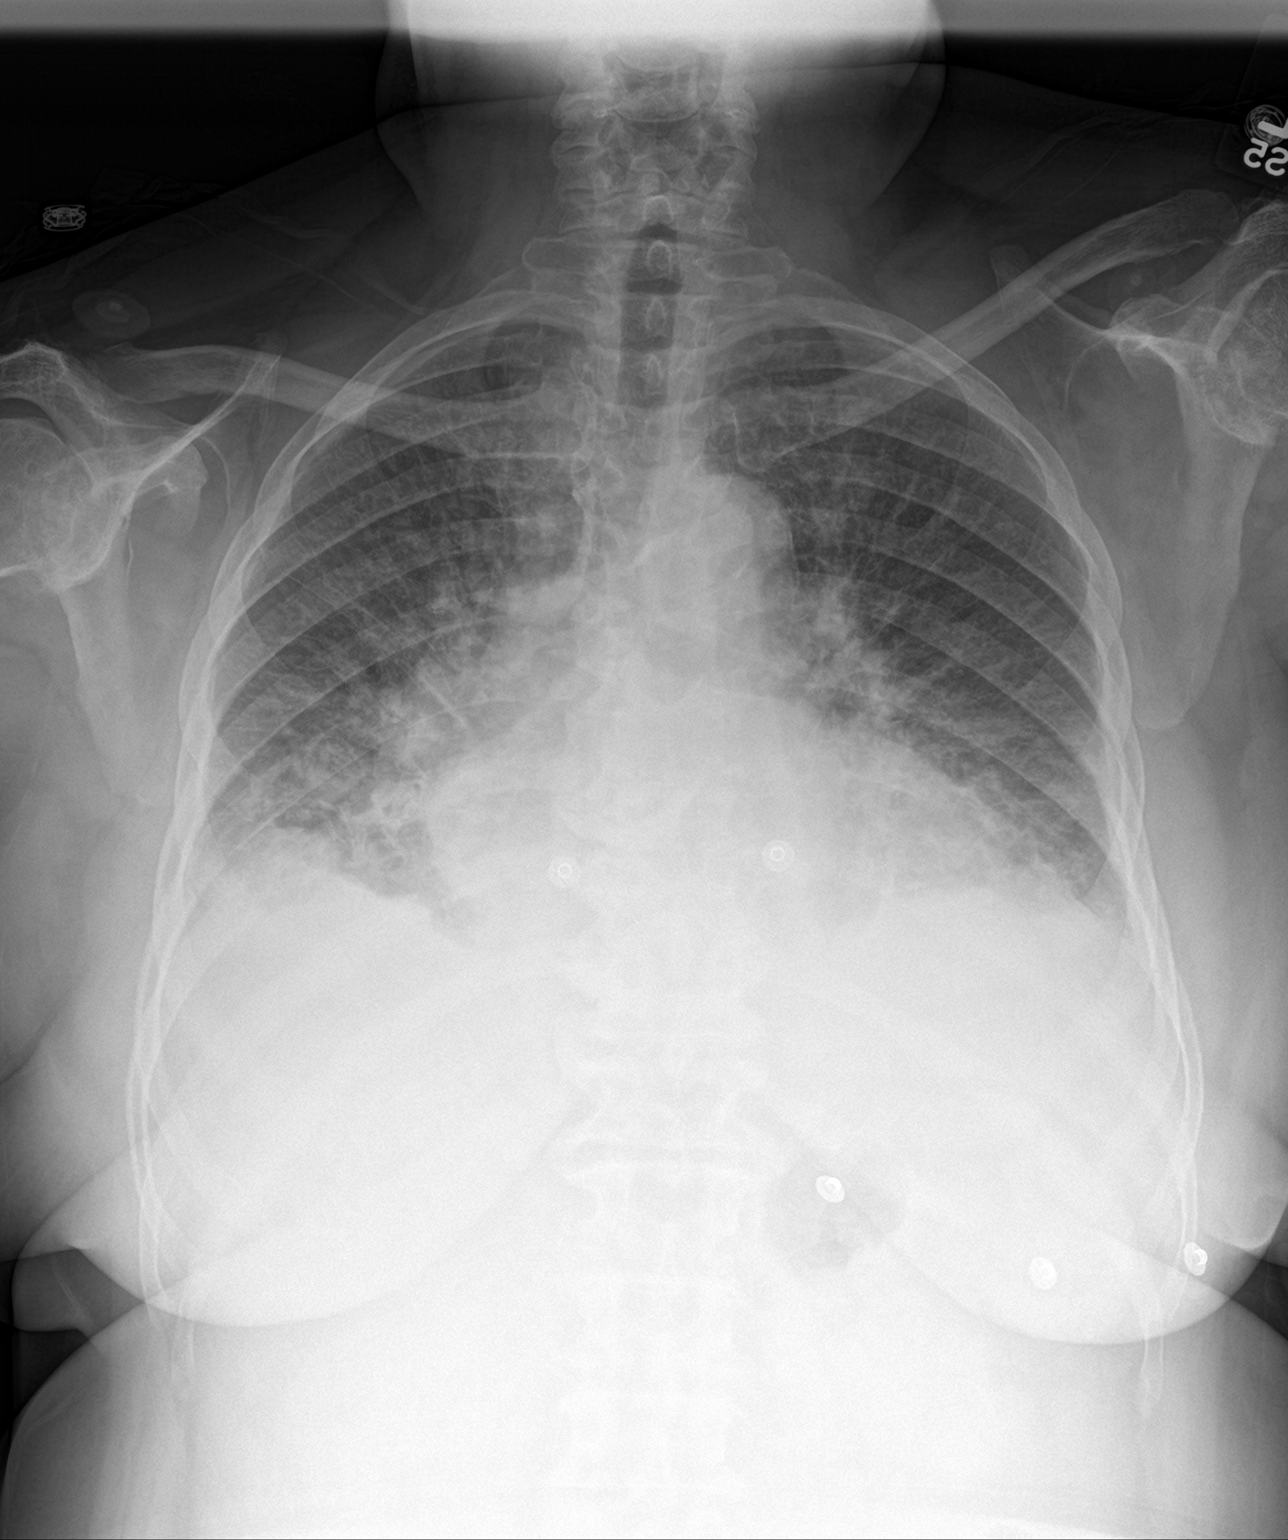

[chest lat]
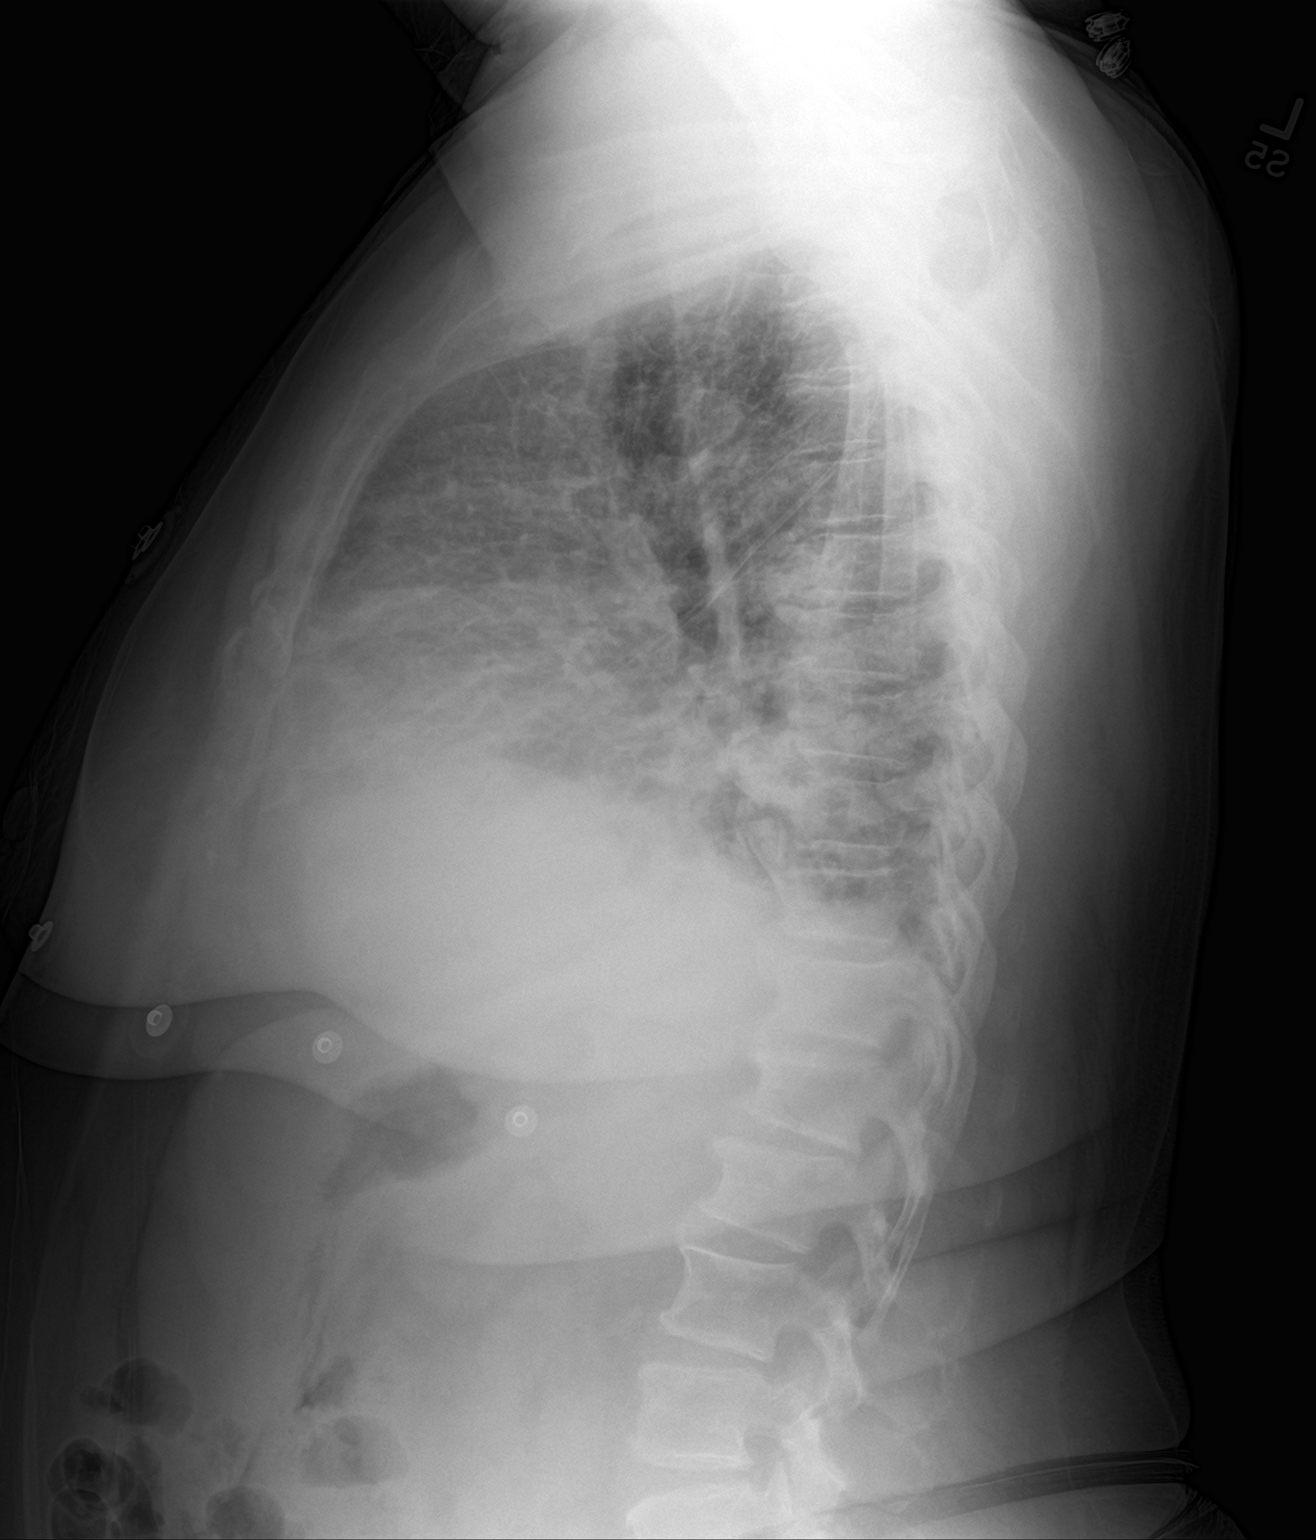

[2 of 2 positions shown; findings below may reference images not displayed]

FINDINGS: Cardiomegaly is noted. Central vascular congestion and mild
perihilar interstitial prominence highly suspicious for mild
pulmonary edema. Probable small bilateral pleural effusion with
bilateral basilar atelectasis or infiltrate. Degenerative changes
mid and lower thoracic spine.
IMPRESSION: Central vascular congestion. Mild perihilar interstitial prominence
highly suspicious for pulmonary edema. Question small bilateral
pleural effusion. Bilateral basilar atelectasis or infiltrate.

## 2017-08-05 ENCOUNTER — Other Ambulatory Visit: Payer: Self-pay | Admitting: Cardiology

## 2017-08-05 DIAGNOSIS — I10 Essential (primary) hypertension: Secondary | ICD-10-CM

## 2017-08-05 DIAGNOSIS — I420 Dilated cardiomyopathy: Secondary | ICD-10-CM

## 2017-08-05 DIAGNOSIS — Z789 Other specified health status: Secondary | ICD-10-CM

## 2017-08-05 DIAGNOSIS — E782 Mixed hyperlipidemia: Secondary | ICD-10-CM

## 2017-08-05 DIAGNOSIS — I5041 Acute combined systolic (congestive) and diastolic (congestive) heart failure: Secondary | ICD-10-CM

## 2017-08-05 DIAGNOSIS — I5043 Acute on chronic combined systolic (congestive) and diastolic (congestive) heart failure: Secondary | ICD-10-CM

## 2017-08-05 DIAGNOSIS — I2510 Atherosclerotic heart disease of native coronary artery without angina pectoris: Secondary | ICD-10-CM

## 2017-09-16 ENCOUNTER — Other Ambulatory Visit: Payer: Self-pay | Admitting: Cardiology

## 2017-09-16 DIAGNOSIS — Z789 Other specified health status: Secondary | ICD-10-CM

## 2017-09-16 DIAGNOSIS — I5041 Acute combined systolic (congestive) and diastolic (congestive) heart failure: Secondary | ICD-10-CM

## 2017-09-16 DIAGNOSIS — I2510 Atherosclerotic heart disease of native coronary artery without angina pectoris: Secondary | ICD-10-CM

## 2017-09-16 DIAGNOSIS — I5043 Acute on chronic combined systolic (congestive) and diastolic (congestive) heart failure: Secondary | ICD-10-CM

## 2017-09-16 DIAGNOSIS — I420 Dilated cardiomyopathy: Secondary | ICD-10-CM

## 2017-09-16 DIAGNOSIS — E782 Mixed hyperlipidemia: Secondary | ICD-10-CM

## 2017-09-16 DIAGNOSIS — I10 Essential (primary) hypertension: Secondary | ICD-10-CM

## 2017-12-02 ENCOUNTER — Other Ambulatory Visit: Payer: Self-pay | Admitting: Cardiology

## 2017-12-11 ENCOUNTER — Encounter: Payer: Self-pay | Admitting: Cardiology

## 2018-01-12 ENCOUNTER — Encounter: Payer: Self-pay | Admitting: Cardiology

## 2018-01-12 ENCOUNTER — Ambulatory Visit (INDEPENDENT_AMBULATORY_CARE_PROVIDER_SITE_OTHER): Payer: Medicare Other | Admitting: Cardiology

## 2018-01-12 VITALS — BP 114/60 | HR 53 | Ht 62.0 in | Wt 185.4 lb

## 2018-01-12 DIAGNOSIS — I5032 Chronic diastolic (congestive) heart failure: Secondary | ICD-10-CM | POA: Diagnosis not present

## 2018-01-12 DIAGNOSIS — N189 Chronic kidney disease, unspecified: Secondary | ICD-10-CM

## 2018-01-12 DIAGNOSIS — N179 Acute kidney failure, unspecified: Secondary | ICD-10-CM

## 2018-01-12 DIAGNOSIS — I2510 Atherosclerotic heart disease of native coronary artery without angina pectoris: Secondary | ICD-10-CM | POA: Diagnosis not present

## 2018-01-12 MED ORDER — METOPROLOL SUCCINATE ER 25 MG PO TB24: 12.5000 mg | ORAL_TABLET | Freq: Every day | ORAL | 3 refills | Status: DC

## 2018-01-12 MED ORDER — SPIRONOLACTONE 25 MG PO TABS: 25.0000 mg | ORAL_TABLET | Freq: Every day | ORAL | 3 refills | Status: DC

## 2018-01-12 NOTE — Progress Notes (Signed)
Cardiology Office Note    Date:  01/12/2018   ID:  Jennifer Kaufman 06/19/1945, MRN [id number]  PCP:  Olive Bass, MD  Cardiologist: Tobias Alexander, MD  Reason for visit: 6 months follow-up  History of Present Illness:  Jennifer Kaufman is a 72 y.o. 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  Follow-up echo 06/2015 LVEF 50-55%.  She has nonobstructive CAD with 65% mid RCA FFR non-physiologically significant, on cath 06/2015.  Patient last saw Dr. Delton See 10/2016 and was doing well.   Patient's creatinine went up to 1.63 and Dr. Delton See asked her to stop her Lasix and potassium 05/12/17.  She comes in today accompanied by her son for follow-up.  Since stopping the Lasix she has had no further increase in edema or weight gain.  She denies dyspnea, dyspnea on exertion dizziness or presyncope.  01/12/2018 -this is 6 months follow-up, she has been doing well, she recently joined her church weight loss group and has lost 10 pounds in 10 days.  She denies any orthopnea proximal nocturnal dyspnea and no lower extremity edema she has no chest pain or dyspnea on exertion.  No palpitations, she has mild exertional dizziness and mild orthostatic hypotension.  No falls.  Her creatinine was checked at her primary care physician yesterday and it was 2.3 from previous 1.28.  Past Medical History:  Diagnosis Date  . Anemia   . Arthritis    "all over"  . CHF (congestive heart failure) (HCC) dx'd 06/22/2015  . Gout   . High cholesterol   . History of blood transfusion    'w/hysterectomy"  . Hypertension   . Nonischemic cardiomyopathy (HCC)    a. echo 06/22/15 EF of 20-25% with akinesis of the anterior wall b. R & L cath 06/25/15 -nonobstructive RCA (65%) lesion by FFR, Elevated LVEDP --> medical managment   . Type II diabetes mellitus (HCC)     Past Surgical History:  Procedure Laterality Date  . ABDOMINAL HYSTERECTOMY      . CARDIAC CATHETERIZATION N/A 06/25/2015   Procedure: Right/Left Heart Cath and Coronary Angiography;  Surgeon: Marykay Lex, MD;  Location: [facility] INVASIVE CV LAB;  Service: Cardiovascular;  Laterality: N/A;  . CARPAL TUNNEL RELEASE Bilateral   . JOINT REPLACEMENT    . MENISCUS REPAIR Left    "scoped"  . THYROID LOBECTOMY    . TOTAL HIP ARTHROPLASTY Left 2000  . TUBAL LIGATION      Current Medications: Current Meds  Medication Sig  . aspirin EC 81 MG EC tablet Take 1 tablet (81 mg total) by mouth daily.  . nitroGLYCERIN (NITROSTAT) 0.4 MG SL tablet DISSOLVE 1 TABLET UNDER THE TONGUE EVERY 5 MINUTES AS NEEDED FOR CHEST PAIN  . pravastatin (PRAVACHOL) 40 MG tablet TAKE 1 TABLET(40 MG) BY MOUTH EVERY EVENING  . ULORIC 40 MG tablet Take 40 mg by mouth daily.   . [DISCONTINUED] furosemide (LASIX) 20 MG tablet TAKE 2 TABLETS(40 MG) BY MOUTH DAILY  . [DISCONTINUED] metoprolol tartrate (LOPRESSOR) 25 MG tablet TAKE 1 TABLET(25 MG) BY MOUTH TWICE DAILY  . [DISCONTINUED] potassium chloride (K-DUR,KLOR-CON) 10 MEQ tablet TAKE 1 TABLET(10 MEQ) BY MOUTH DAILY  . [DISCONTINUED] spironolactone (ALDACTONE) 25 MG tablet Take 1 tablet (25 mg total) by mouth 2 (two) times daily.     Allergies:   Atorvastatin; Carvedilol; Hydrochlorothiazide; and Lisinopril   Social History   Socioeconomic History  . Marital  status: Widowed    Spouse name: Not on file  . Number of children: Not on file  . Years of education: Not on file  . Highest education level: Not on file  Occupational History  . Not on file  Social Needs  . Financial resource strain: Not on file  . Food insecurity:    Worry: Not on file    Inability: Not on file  . Transportation needs:    Medical: Not on file    Non-medical: Not on file  Tobacco Use  . Smoking status: Never Smoker  . Smokeless tobacco: Current User    Types: Snuff  Substance and Sexual Activity  . Alcohol use: No  . Drug use: No  . Sexual activity: Never   Lifestyle  . Physical activity:    Days per week: Not on file    Minutes per session: Not on file  . Stress: Not on file  Relationships  . Social connections:    Talks on phone: Not on file    Gets together: Not on file    Attends religious service: Not on file    Active member of club or organization: Not on file    Attends meetings of clubs or organizations: Not on file    Relationship status: Not on file  Other Topics Concern  . Not on file  Social History Narrative  . Not on file     Family History:  The patient's family history includes Hypertension in her mother.   ROS:   Please see the history of present illness.    Review of Systems  Constitution: Negative.  HENT: Negative.   Eyes: Negative.   Cardiovascular: Negative.   Respiratory: Negative.   Hematologic/Lymphatic: Negative.   Musculoskeletal: Negative.  Negative for joint pain.  Gastrointestinal: Negative.   Genitourinary: Negative.   Neurological: Negative.    All other systems reviewed and are negative.   PHYSICAL EXAM:   VS:  BP 114/60   Pulse (!) 53   Ht 5\' 2"  (1.575 m)   Wt 185 lb 6.4 oz (84.1 kg)   BMI 33.91 kg/m   Physical Exam  GEN: Well nourished, well developed, in no acute distress  Neck: no JVD, carotid bruits, or masses Cardiac:RRR; no murmurs, rubs, or gallops  Respiratory:  clear to auscultation bilaterally, normal work of breathing GI: soft, nontender, nondistended, + BS Ext: Trace of ankle edema bilaterally without cyanosis, clubbing, Good distal pulses bilaterally Neuro:  Alert and Oriented x 3 Psych: euthymic mood, full affect  Wt Readings from Last 3 Encounters:  01/12/18 185 lb 6.4 oz (84.1 kg)  05/19/17 194 lb (88 kg)  10/23/16 194 lb (88 kg)    Studies/Labs Reviewed:   EKG:  EKG is ordered today.  EKG showed sinus bradycardia at 53 bpm, otherwise normal EKG unchanged from prior, this was personally reviewed.  Recent Labs: 05/12/2017: ALT 8; Hemoglobin 12.0; Platelets  244 05/19/2017: BUN 20; Creatinine, Ser 1.28; Potassium 4.6; Sodium 141; TSH 3.050   Lipid Panel    Component Value Date/Time   CHOL 181 05/12/2017 0826   TRIG 159 (H) 05/12/2017 0826   HDL 34 (L) 05/12/2017 0826   CHOLHDL 5.3 (H) 05/12/2017 0826   CHOLHDL 3.6 06/23/2015 0336   VLDL 23 06/23/2015 0336   LDLCALC 115 (H) 05/12/2017 0826    Additional studies/ records that were reviewed today include:  Cardiac cath 4/10/17Conclusion   1. Mid RCA lesion, 65% stenosed. FFR non-physiologically significant 2. No severe  cardiomyopathy, likely nonischemic 3. Elevated LVEDP is consistent with Pulmonary Capillary Wedge Pressure monitoring.   Nonobstructive RCA lesion by FFR Nonischemic Cardiomyopathy.   Plan:  Return to nursing unit after sheath removal.  Continue IV diuresis and optimization of medical management.  Would also treat for noneffective CAD with statin and aspirin.     Echo 06/22/15 - Left ventricle: The cavity size was normal. Wall thickness was   increased in a pattern of mild LVH. Systolic function was   severely reduced. The estimated ejection fraction was in the   range of 20% to 25%. Diffuse hypokinesis. There is akinesis of   the anteroseptal and apical myocardium. Doppler parameters are   consistent with restrictive physiology, indicative of decreased   left ventricular diastolic compliance and/or increased left   atrial pressure. - Aortic valve: There was mild regurgitation. - Mitral valve: There was mild regurgitation. - Pericardium, extracardiac: A small pericardial effusion was   identified. There was a left pleural effusion.   Impressions:   - Diffuse hypokinesis with akinesis of the anteroseptal wall and   apex; overall severely reduced LV function; restrictive filling;   mild AI and MR; small pericardial effusion with mild RA collapse. _____________            TTE: 10/10/2015 Left ventricle: The cavity size was mildly dilated. Wall   thickness  was increased in a pattern of moderate LVH. Systolic   function was normal. The estimated ejection fraction was in the   range of 50% to 55%. Left ventricular diastolic function   parameters were normal. - Aortic valve: There was mild regurgitation. - Mitral valve: There was mild regurgitation. - Atrial septum: No defect or patent foramen ovale was identified.    ASSESSMENT:    1. Coronary artery disease involving native coronary artery of native heart without angina pectoris   2. Chronic diastolic CHF (congestive heart failure) (HCC)   3. Acute renal failure superimposed on chronic kidney disease, unspecified CKD stage, unspecified acute renal failure type (HCC)      PLAN:  In order of problems listed above:  Chronic diastolic CHF, history of dilated cardiomyopathy last LVEF improved from 20 to 25% to 50-55% on the echocardiogram in 2017.  She has no signs of fluid overload, worsening creatinine, I will discontinue Lasix and potassium supplements, decrease spironolactone dose to once daily, switch Lopressor to Toprol-XL 12.5 mg daily.  Essential hypertension -blood pressure rather low with signs of orthostatic hypotension, I will change her medication as stated above.  Nonobstructive CAD cath in 06/2015, 65% RCA-asymptomatic  Hyperlipidemia -on pravastatin 40 mg daily that is tolerated well.  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 Follow-up with me in 3 months.    Medication Adjustments/Labs and Tests Ordered: Current medicines are reviewed at length with the patient today.  Concerns regarding medicines are outlined above.  Medication changes, Labs and Tests ordered today are listed in the Patient Instructions below. There are no Patient  Instructions on file for this visit.   Signed, Tobias Alexander, MD  01/12/2018 4:42 PM    [facility] [address] [place], [place], [place]  [address] Phone: [phone]; Fax: [phone]

## 2018-01-12 NOTE — Patient Instructions (Signed)
Medication Instructions:   STOP TAKING LASIX NOW  STOP TAKING POTASSIUM CHLORIDE NOW  STOP TAKING METOPROLOL TARTRATE NOW  DECREASE YOUR SPIRONOLACTONE TO 25 MG ONCE DAILY  START TAKING METOPROLOL SUCCINATE (TOPROL XL) 12.5 MG ONCE DAILY   If you need a refill on your cardiac medications before your next appointment, please call your pharmacy.      Lab work:  BMET IN 2 WEEKS--SAME DAY AS YOU SEE OUR PHARMACIST IN BP CLINIC  If you have labs (blood work) drawn today and your tests are completely normal, you will receive your results only by: Marland Kitchen MyChart Message (if you have MyChart) OR . A paper copy in the mail If you have any lab test that is abnormal or we need to change your treatment, we will call you to review the results.     Follow-Up:  2 WEEKS WITH OUR PHARMACIST IN [facility] CLINIC---PLEASE HAVE A BMET DONE SAME DAY   3 MONTHS WITH DR Delton See

## 2018-01-15 ENCOUNTER — Ambulatory Visit: Payer: Medicare Other | Admitting: Cardiology

## 2018-01-15 ENCOUNTER — Encounter

## 2018-01-20 NOTE — Addendum Note (Signed)
Addended by: Madalyn Rob A on: 01/20/2018 11:50 AM   Modules accepted: Orders

## 2018-01-28 ENCOUNTER — Other Ambulatory Visit: Payer: Medicare Other | Admitting: *Deleted

## 2018-01-28 ENCOUNTER — Encounter: Payer: Self-pay | Admitting: Pharmacist

## 2018-01-28 ENCOUNTER — Ambulatory Visit (INDEPENDENT_AMBULATORY_CARE_PROVIDER_SITE_OTHER): Payer: Medicare Other | Admitting: Pharmacist

## 2018-01-28 VITALS — BP 132/80 | HR 65

## 2018-01-28 DIAGNOSIS — I10 Essential (primary) hypertension: Secondary | ICD-10-CM | POA: Diagnosis not present

## 2018-01-28 DIAGNOSIS — I2510 Atherosclerotic heart disease of native coronary artery without angina pectoris: Secondary | ICD-10-CM

## 2018-01-28 DIAGNOSIS — N179 Acute kidney failure, unspecified: Secondary | ICD-10-CM

## 2018-01-28 DIAGNOSIS — I5032 Chronic diastolic (congestive) heart failure: Secondary | ICD-10-CM

## 2018-01-28 DIAGNOSIS — N189 Chronic kidney disease, unspecified: Secondary | ICD-10-CM

## 2018-01-28 LAB — BASIC METABOLIC PANEL WITH GFR
BUN/Creatinine Ratio: 12 (ref 12–28)
BUN: 19 mg/dL (ref 8–27)
CO2: 21 mmol/L (ref 20–29)
Calcium: 9.4 mg/dL (ref 8.7–10.3)
Chloride: 107 mmol/L — ABNORMAL HIGH (ref 96–106)
Creatinine, Ser: 1.59 mg/dL — ABNORMAL HIGH (ref 0.57–1.00)
GFR calc Af Amer: 37 mL/min/1.73 — ABNORMAL LOW
GFR calc non Af Amer: 32 mL/min/1.73 — ABNORMAL LOW
Glucose: 82 mg/dL (ref 65–99)
Potassium: 4.2 mmol/L (ref 3.5–5.2)
Sodium: 140 mmol/L (ref 134–144)

## 2018-01-28 NOTE — Progress Notes (Signed)
Patient ID: Jennifer Kaufman                 DOB: 09/06/1945                      MRN: [id number]     HPI: Jennifer Kaufman is a 72 y.o. female patient of Dr. Delton See who presents today for hypertension evaluation. 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 Patient last saw Dr. Delton See on 01/12/18 and Lopressor was switched to Toprol XL 12.5 mg daily and spironolactone was decreased to once daily due to signs of orthostatic hypotension andLlasix was discontinued due to worsening renal function. SCr at last visit with PCP reportedly 2.3.   Patient presents today for blood pressure management. She has taken her medications today and she is checking her blood pressure at home. She uses a wrist cuff to check her blood pressures, reports that it is a "couple of years old." She reports that she's seen blood pressures of 117/66, 127/72 highest.   Patient denies any dizziness or feeling like her blood pressure is low. Denies swelling with stopping of Lasix. There was a slight amount of swelling at her sock line    Current HTN meds:  Metoprolol succinate 12.5 mg daily (AM) Spironolactone 25 mg daily (AM)  Previously tried: irbesartan, valsartan, hydrochlorothiazide (hypokalemia), lisinopril (cough), carvedilol (myalgias), furosemide (worsened renal function)  BP goal: <130/80  Family History: The patient's family history includes Hypertension in her mother.   Social History: Never smoker, uses smokeless tobacco (snuff). Denies alcohol and illicit drug use.   Diet: eats mostly at home. Eats meat. Does add salt to food, does not oversalt. Drinks juice, soda, a lot of water, tea (sweet and unsweet). Eats vegeatbles (cabbage, greens, salad), eats fruits (apples, grapes, tomatoes)  Exercise: She can walk, walks 1/4-1/2 mile a few times a week, she gets tired after walking that long.   Home BP readings:   Wt Readings from Last 3  Encounters:  01/12/18 185 lb 6.4 oz (84.1 kg)  05/19/17 194 lb (88 kg)  10/23/16 194 lb (88 kg)   BP Readings from Last 3 Encounters:  01/28/18 132/80  01/12/18 114/60  05/19/17 (!) 150/70   Pulse Readings from Last 3 Encounters:  01/28/18 65  01/12/18 (!) 53  05/19/17 70    Renal function: CrCl cannot be calculated (Patient's most recent lab result is older than the maximum 21 days allowed.).  Past Medical History:  Diagnosis Date  . Anemia   . Arthritis    "all over"  . CHF (congestive heart failure) (HCC) dx'd 06/22/2015  . Gout   . High cholesterol   . History of blood transfusion    'w/hysterectomy"  . Hypertension   . Nonischemic cardiomyopathy (HCC)    a. echo 06/22/15 EF of 20-25% with akinesis of the anterior wall b. R & L cath 06/25/15 -nonobstructive RCA (65%) lesion by FFR, Elevated LVEDP --> medical managment   . Type II diabetes mellitus (HCC)     Current Outpatient Medications on File Prior to Visit  Medication Sig Dispense Refill  . aspirin EC 81 MG EC tablet Take 1 tablet (81 mg total) by mouth daily.    . metoprolol succinate (TOPROL XL) 25 MG 24 hr tablet Take 0.5 tablets (12.5 mg total) by mouth daily. 30 tablet 3  . nitroGLYCERIN (NITROSTAT) 0.4 MG SL tablet DISSOLVE 1 TABLET UNDER  THE TONGUE EVERY 5 MINUTES AS NEEDED FOR CHEST PAIN 75 tablet 6  . pravastatin (PRAVACHOL) 40 MG tablet TAKE 1 TABLET(40 MG) BY MOUTH EVERY EVENING 90 tablet 2  . spironolactone (ALDACTONE) 25 MG tablet Take 1 tablet (25 mg total) by mouth daily. 30 tablet 3  . ULORIC 40 MG tablet Take 40 mg by mouth daily.   11   No current facility-administered medications on file prior to visit.     Allergies  Allergen Reactions  . Atorvastatin     Other reaction(s): GI Upset (intolerance) Chest discomfort  . Carvedilol Other (See Comments)    myalgias  . Hydrochlorothiazide Other (See Comments)    hypokalemia  . Lisinopril Cough    Blood pressure 132/80, pulse 65, SpO2 98  %.   Assessment/Plan: Hypertension: Blood pressure at goal in office and per patient recall of home blood pressures. No medication changes today. Repeat BMET to check on renal function. Counseled patient to continue taking medications as directed and to continue checking blood pressure. Counseled patient to monitor swelling in ankles and feet with discontinuation of Lasix. Asked her to monitor her weight and give a call to the clinic if she gains 3 pounds in a day or 5 pounds in a week. Follow up with Dr. Delton See as scheduled.   Patient seen with Lyanne Co, PharmD Student  Thank you, Freddie Apley. Cleatis Polka, PharmD  [facility]  01/28/2018 11:05 AM  BMET today : Scr 1.59 and improving now off Lasix and decreased spironolactone dose. Will continue as above as anticipate continue to trend back to baseline now that off Lasix.

## 2018-01-28 NOTE — Patient Instructions (Signed)
Thank you for coming to see [place] today!  CONTINUE taking metoprolol succinate and spironolactone. No changes in your medication today.  Continue checking your blood pressure at home.   Keep an eye on if your legs and feet start swelling. When you're sitting at home, prop your legs up to help your heart pump to your feet better. Start weighing yourself as well to see if you are retaining a lot of fluid. If you gain 3 pounds in a day or 5 pounds in a week, give our office a call at #[phone].   We will call you if there is anything notable with your labs from today.   Follow up with Dr. Delton See as scheduled.

## 2018-02-10 ENCOUNTER — Telehealth: Payer: Self-pay | Admitting: Cardiology

## 2018-02-10 NOTE — Telephone Encounter (Signed)
Pts daughter Gabriel Rung (on [place]) is just calling to clarify what current dose of statin is the pt taking.  Endorsed to the pts daughter that she is on Pravastatin 40 po daily.  Daughter verbalized understanding and gracious for all the assistance provided.

## 2018-02-10 NOTE — Telephone Encounter (Signed)
Message  Received: Today  Message Contents  Levin Bacon [facility]  Loa Socks, LPN        Hey Yes I saw this - his Scr on 01/11/18 was 2.3(in care everywhere) and is now trending down. We kept him off Lasix and on the lower dose of spironolactone since his labs were trending back toward his baseline of 1.2.   Thanks!  Nicholaus Bloom   Previous Messages    ----- Message -----  From: Loa Socks, LPN  Sent: 26/20/3559 11:27 AM EST  To: Levin Bacon, [facility]   Cletis Media, were you able to see this bmet from 11/14. Dr. Delton See did not interpret and I saw where you saw this pt in BP clinic and ordered this. Thanks for all you do.

## 2018-02-10 NOTE — Telephone Encounter (Signed)
New message    Pt daughter is calling with questions about her cholesterol and medication

## 2018-04-15 ENCOUNTER — Ambulatory Visit (INDEPENDENT_AMBULATORY_CARE_PROVIDER_SITE_OTHER): Payer: Medicare Other | Admitting: Cardiology

## 2018-04-15 ENCOUNTER — Encounter (INDEPENDENT_AMBULATORY_CARE_PROVIDER_SITE_OTHER): Payer: Self-pay

## 2018-04-15 ENCOUNTER — Encounter: Payer: Self-pay | Admitting: Cardiology

## 2018-04-15 VITALS — BP 136/72 | HR 96 | Ht 61.5 in | Wt 188.0 lb

## 2018-04-15 DIAGNOSIS — I2510 Atherosclerotic heart disease of native coronary artery without angina pectoris: Secondary | ICD-10-CM

## 2018-04-15 DIAGNOSIS — I5032 Chronic diastolic (congestive) heart failure: Secondary | ICD-10-CM

## 2018-04-15 DIAGNOSIS — R7989 Other specified abnormal findings of blood chemistry: Secondary | ICD-10-CM | POA: Diagnosis not present

## 2018-04-15 DIAGNOSIS — I10 Essential (primary) hypertension: Secondary | ICD-10-CM | POA: Diagnosis not present

## 2018-04-15 DIAGNOSIS — N183 Chronic kidney disease, stage 3 unspecified: Secondary | ICD-10-CM

## 2018-04-15 NOTE — Progress Notes (Signed)
Cardiology Office Note    Date:  04/15/2018   ID:  Jennifer Kaufman, Jennifer Kaufman 12-30-1945, MRN [id number]  PCP:  Olive Bass, MD  Cardiologist: Tobias Alexander, MD  Reason for visit: 6 months follow-up  History of Present Illness:  Jennifer Kaufman is a 73 y.o. 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  Follow-up echo 06/2015 LVEF 50-55%.  She has nonobstructive CAD with 65% mid RCA FFR non-physiologically significant, on cath 06/2015.  Patient last saw Dr. Delton Kaufman 10/2016 and was doing well.   Patient's creatinine went up to 1.63 and Dr. Delton Kaufman asked her to stop her Lasix and potassium 05/12/17.  She comes in today accompanied by her son for follow-up.  Since stopping the Lasix she has had no further increase in edema or weight gain.  She denies dyspnea, dyspnea on exertion dizziness or presyncope.  01/12/2018 -this is 6 months follow-up, she has been doing well, she recently joined her church weight loss group and has lost 10 pounds in 10 days.  She denies any orthopnea proximal nocturnal dyspnea and no lower extremity edema she has no chest pain or dyspnea on exertion.  No palpitations, she has mild exertional dizziness and mild orthostatic hypotension.  No falls.  Her creatinine was checked at her primary care physician yesterday and it was 2.3 from previous 1.28.  04/15/2018, this is 3 months follow-up, the patient has been doing great, since chest pain shortness of breath no lower extremity edema, she wears compression socks, no orthopnea proximal.  She has been experiencing occasional night sweats.  No associated cough.  Past Medical History:  Diagnosis Date  . Anemia   . Arthritis    "all over"  . CHF (congestive heart failure) (HCC) dx'd 06/22/2015  . Gout   . High cholesterol   . History of blood transfusion    'w/hysterectomy"  . Hypertension   . Nonischemic cardiomyopathy (HCC)    a. echo 06/22/15 EF of  20-25% with akinesis of the anterior wall b. R & L cath 06/25/15 -nonobstructive RCA (65%) lesion by FFR, Elevated LVEDP --> medical managment   . Type II diabetes mellitus (HCC)     Past Surgical History:  Procedure Laterality Date  . ABDOMINAL HYSTERECTOMY    . CARDIAC CATHETERIZATION N/A 06/25/2015   Procedure: Right/Left Heart Cath and Coronary Angiography;  Surgeon: Marykay Lex, MD;  Location: [facility] INVASIVE CV LAB;  Service: Cardiovascular;  Laterality: N/A;  . CARPAL TUNNEL RELEASE Bilateral   . JOINT REPLACEMENT    . MENISCUS REPAIR Left    "scoped"  . THYROID LOBECTOMY    . TOTAL HIP ARTHROPLASTY Left 2000  . TUBAL LIGATION     Current Medications: Current Meds  Medication Sig  . aspirin EC 81 MG EC tablet Take 1 tablet (81 mg total) by mouth daily.  . metoprolol succinate (TOPROL XL) 25 MG 24 hr tablet Take 0.5 tablets (12.5 mg total) by mouth daily.  . nitroGLYCERIN (NITROSTAT) 0.4 MG SL tablet DISSOLVE 1 TABLET UNDER THE TONGUE EVERY 5 MINUTES AS NEEDED FOR CHEST PAIN  . pravastatin (PRAVACHOL) 40 MG tablet TAKE 1 TABLET(40 MG) BY MOUTH EVERY EVENING  . spironolactone (ALDACTONE) 25 MG tablet Take 1 tablet (25 mg total) by mouth daily.  Marland Kitchen ULORIC 40 MG tablet Take 40 mg by mouth daily.     Allergies:   Atorvastatin; Carvedilol; Hydrochlorothiazide; and Lisinopril   Social History  Socioeconomic History  . Marital status: Widowed    Spouse name: Not on file  . Number of children: Not on file  . Years of education: Not on file  . Highest education level: Not on file  Occupational History  . Not on file  Social Needs  . Financial resource strain: Not on file  . Food insecurity:    Worry: Not on file    Inability: Not on file  . Transportation needs:    Medical: Not on file    Non-medical: Not on file  Tobacco Use  . Smoking status: Never Smoker  . Smokeless tobacco: Current User    Types: Snuff  Substance and Sexual Activity  . Alcohol use: No  . Drug use:  No  . Sexual activity: Never  Lifestyle  . Physical activity:    Days per week: Not on file    Minutes per session: Not on file  . Stress: Not on file  Relationships  . Social connections:    Talks on phone: Not on file    Gets together: Not on file    Attends religious service: Not on file    Active member of club or organization: Not on file    Attends meetings of clubs or organizations: Not on file    Relationship status: Not on file  Other Topics Concern  . Not on file  Social History Narrative  . Not on file    Family History:  The patient's family history includes Hypertension in her mother.   ROS:   Please Kaufman the history of present illness.    Review of Systems  Constitution: Negative.  HENT: Negative.   Eyes: Negative.   Cardiovascular: Negative.   Respiratory: Negative.   Hematologic/Lymphatic: Negative.   Musculoskeletal: Negative.  Negative for joint pain.  Gastrointestinal: Negative.   Genitourinary: Negative.   Neurological: Negative.    All other systems reviewed and are negative.  PHYSICAL EXAM:   VS:  BP 136/72   Pulse 96   Ht 5' 1.5" (1.562 m)   Wt 188 lb (85.3 kg)   SpO2 97%   BMI 34.95 kg/m   Physical Exam  GEN: Well nourished, well developed, in no acute distress  Neck: no JVD, carotid bruits, or masses Cardiac:RRR; no murmurs, rubs, or gallops  Respiratory:  clear to auscultation bilaterally, normal work of breathing GI: soft, nontender, nondistended, + BS Ext: Trace of ankle edema bilaterally without cyanosis, clubbing, Good distal pulses bilaterally Neuro:  Alert and Oriented x 3 Psych: euthymic mood, full affect  Wt Readings from Last 3 Encounters:  04/15/18 188 lb (85.3 kg)  01/12/18 185 lb 6.4 oz (84.1 kg)  05/19/17 194 lb (88 kg)    Studies/Labs Reviewed:   EKG:  EKG is ordered today.  EKG showed sinus bradycardia at 53 bpm, otherwise normal EKG unchanged from prior, this was personally reviewed.  Recent Labs: 05/12/2017: ALT  8; Hemoglobin 12.0; Platelets 244 05/19/2017: TSH 3.050 01/28/2018: BUN 19; Creatinine, Ser 1.59; Potassium 4.2; Sodium 140   Lipid Panel    Component Value Date/Time   CHOL 181 05/12/2017 0826   TRIG 159 (H) 05/12/2017 0826   HDL 34 (L) 05/12/2017 0826   CHOLHDL 5.3 (H) 05/12/2017 0826   CHOLHDL 3.6 06/23/2015 0336   VLDL 23 06/23/2015 0336   LDLCALC 115 (H) 05/12/2017 0826   Additional studies/ records that were reviewed today include:  Cardiac cath 4/10/17Conclusion   1. Mid RCA lesion, 65% stenosed. FFR non-physiologically  significant 2. No severe cardiomyopathy, likely nonischemic 3. Elevated LVEDP is consistent with Pulmonary Capillary Wedge Pressure monitoring.   Nonobstructive RCA lesion by FFR Nonischemic Cardiomyopathy.   Plan:  Return to nursing unit after sheath removal.  Continue IV diuresis and optimization of medical management.  Would also treat for noneffective CAD with statin and aspirin.     Echo 06/22/15 - Left ventricle: The cavity size was normal. Wall thickness was   increased in a pattern of mild LVH. Systolic function was   severely reduced. The estimated ejection fraction was in the   range of 20% to 25%. Diffuse hypokinesis. There is akinesis of   the anteroseptal and apical myocardium. Doppler parameters are   consistent with restrictive physiology, indicative of decreased   left ventricular diastolic compliance and/or increased left   atrial pressure. - Aortic valve: There was mild regurgitation. - Mitral valve: There was mild regurgitation. - Pericardium, extracardiac: A small pericardial effusion was   identified. There was a left pleural effusion.   Impressions:   - Diffuse hypokinesis with akinesis of the anteroseptal wall and   apex; overall severely reduced LV function; restrictive filling;   mild AI and MR; small pericardial effusion with mild RA collapse. _____________            TTE: 10/10/2015 Left ventricle: The cavity  size was mildly dilated. Wall   thickness was increased in a pattern of moderate LVH. Systolic   function was normal. The estimated ejection fraction was in the   range of 50% to 55%. Left ventricular diastolic function   parameters were normal. - Aortic valve: There was mild regurgitation. - Mitral valve: There was mild regurgitation. - Atrial septum: No defect or patent foramen ovale was identified.    ASSESSMENT:    1. Essential hypertension   2. Coronary artery disease involving native coronary artery of native heart without angina pectoris   3. Chronic diastolic CHF (congestive heart failure) (HCC)    PLAN:  In order of problems listed above:  Chronic diastolic CHF, history of dilated cardiomyopathy last LVEF improved from 20 to 25% to 50-55% on the echocardiogram in 2017.  She appears euvolemic, we will repeat labs today.  Essential hypertension -well-controlled.  Nonobstructive CAD cath in 06/2015, 65% RCA-asymptomatic  Hyperlipidemia -on pravastatin 40 mg daily that is tolerated well.  Acute on chronic kidney failure, creatinine increased from 1.28-2.3, most recent 1.6, we will recheck today.  We discontinued Lasix and she has no lower extremity edema.  Night sweats, however no associated cough no exposure to TB, we will obtain CRP CBC today.  Follow-up in 6 months.  Medication Adjustments/Labs and Tests Ordered: Current medicines are reviewed at length with the patient today.  Concerns regarding medicines are outlined above.  Medication changes, Labs and Tests ordered today are listed in the Patient Instructions below. Patient Instructions  Medication Instructions:   Your physician recommends that you continue on your current medications as directed. Please refer to the Current Medication list given to you today.  If you need a refill on your cardiac medications before your next appointment, please call your pharmacy.      Lab work:  TODAY--CMET, CBC W DIFF,  TSH, AND CRP  If you have labs (blood work) drawn today and your tests are completely normal, you will receive your results only by: Marland Kitchen. MyChart Message (if you have MyChart) OR . A paper copy in the mail If you have any lab test that is abnormal  or we need to change your treatment, we will call you to review the results.     Follow-Up: At [facility], you and your health needs are our priority.  As part of our continuing mission to provide you with exceptional heart care, we have created designated Provider Care Teams.  These Care Teams include your primary Cardiologist (physician) and Advanced Practice Providers (APPs -  Physician Assistants and Nurse Practitioners) who all work together to provide you with the care you need, when you need it. You will need a follow up appointment in 6 months.  Please call our office 2 months in advance to schedule this appointment.  You may Kaufman Tobias AlexanderKatarina Tavaria Mackins, MD or one of the following Advanced Practice Providers on your designated Care Team:   [place]itt[place]y Simmons, PA-C Ronie Spiesayna Dunn, PA-C . Jacolyn ReedyMichele Lenze, PA-C        Signed, Tobias AlexanderKatarina Jiayi Lengacher, MD  04/15/2018 11:18 AM    [facility] [address], [place], [place]  [address] Phone: [phone]; Fax: [phone]) [phone]

## 2018-04-15 NOTE — Patient Instructions (Signed)
Medication Instructions:   Your physician recommends that you continue on your current medications as directed. Please refer to the Current Medication list given to you today.  If you need a refill on your cardiac medications before your next appointment, please call your pharmacy.      Lab work:  TODAY--CMET, CBC W DIFF, TSH, AND CRP  If you have labs (blood work) drawn today and your tests are completely normal, you will receive your results only by: Marland Kitchen MyChart Message (if you have MyChart) OR . A paper copy in the mail If you have any lab test that is abnormal or we need to change your treatment, we will call you to review the results.     Follow-Up: At [facility], you and your health needs are our priority.  As part of our continuing mission to provide you with exceptional heart care, we have created designated Provider Care Teams.  These Care Teams include your primary Cardiologist (physician) and Advanced Practice Providers (APPs -  Physician Assistants and Nurse Practitioners) who all work together to provide you with the care you need, when you need it. You will need a follow up appointment in 6 months.  Please call our office 2 months in advance to schedule this appointment.  You may see Tobias Alexander, MD or one of the following Advanced Practice Providers on your designated Care Team:   [place], PA-C Ronie Spies, PA-C . Jacolyn Reedy, PA-C

## 2018-04-16 LAB — COMPREHENSIVE METABOLIC PANEL
ALT: 7 IU/L (ref 0–32)
AST: 13 IU/L (ref 0–40)
Albumin/Globulin Ratio: 2.4 — ABNORMAL HIGH (ref 1.2–2.2)
Albumin: 4.3 g/dL (ref 3.7–4.7)
Alkaline Phosphatase: 95 IU/L (ref 39–117)
BUN/Creatinine Ratio: 12 (ref 12–28)
BUN: 16 mg/dL (ref 8–27)
Bilirubin Total: 0.5 mg/dL (ref 0.0–1.2)
CO2: 21 mmol/L (ref 20–29)
Calcium: 10 mg/dL (ref 8.7–10.3)
Chloride: 107 mmol/L — ABNORMAL HIGH (ref 96–106)
Creatinine, Ser: 1.38 mg/dL — ABNORMAL HIGH (ref 0.57–1.00)
GFR calc Af Amer: 44 mL/min/{1.73_m2} — ABNORMAL LOW (ref >59)
GFR calc non Af Amer: 38 mL/min/{1.73_m2} — ABNORMAL LOW (ref >59)
Globulin, Total: 1.8 g/dL (ref 1.5–4.5)
Glucose: 104 mg/dL — ABNORMAL HIGH (ref 65–99)
Potassium: 4.2 mmol/L (ref 3.5–5.2)
Sodium: 144 mmol/L (ref 134–144)
Total Protein: 6.1 g/dL (ref 6.0–8.5)

## 2018-04-16 LAB — CBC WITH DIFFERENTIAL/PLATELET
Basophils Absolute: 0 10*3/uL (ref 0.0–0.2)
Basos: 1 %
EOS (ABSOLUTE): 0.2 10*3/uL (ref 0.0–0.4)
Eos: 4 %
Hematocrit: 34.3 % (ref 34.0–46.6)
Hemoglobin: 12.1 g/dL (ref 11.1–15.9)
Immature Grans (Abs): 0 10*3/uL (ref 0.0–0.1)
Immature Granulocytes: 0 %
Lymphocytes Absolute: 1.9 10*3/uL (ref 0.7–3.1)
Lymphs: 33 %
MCH: 33.5 pg — ABNORMAL HIGH (ref 26.6–33.0)
MCHC: 35.3 g/dL (ref 31.5–35.7)
MCV: 95 fL (ref 79–97)
Monocytes Absolute: 0.4 10*3/uL (ref 0.1–0.9)
Monocytes: 7 %
Neutrophils Absolute: 3.2 10*3/uL (ref 1.4–7.0)
Neutrophils: 55 %
Platelets: 278 10*3/uL (ref 150–450)
RBC: 3.61 x10E6/uL — ABNORMAL LOW (ref 3.77–5.28)
RDW: 16 % — ABNORMAL HIGH (ref 11.7–15.4)
WBC: 5.7 10*3/uL (ref 3.4–10.8)

## 2018-04-16 LAB — C-REACTIVE PROTEIN: CRP: 6 mg/L (ref 0–10)

## 2018-04-16 LAB — TSH: TSH: 2.35 u[IU]/mL (ref 0.450–4.500)

## 2018-05-27 ENCOUNTER — Telehealth: Payer: Self-pay | Admitting: Cardiology

## 2018-05-28 ENCOUNTER — Telehealth: Payer: Self-pay | Admitting: *Deleted

## 2018-05-28 NOTE — Telephone Encounter (Signed)
   [facility] Pre-operative Risk Assessment    Request for surgical clearance:  1. What type of surgery is being performed? RIGHT TOTAL KNEE ARTHROPLASTY   2. When is this surgery scheduled? TBD   3. What type of clearance is required (medical clearance vs. Pharmacy clearance to hold med vs. Both)? MEDICAL  4. Are there any medications that need to be held prior to surgery and how long?ASA   5. Practice name and name of physician performing surgery? [facility]; DR. KYLE HUBLER, DO   6. What is your office phone number [phone]    7.   What is your office fax number [phone]  8.   Anesthesia type (None, local, MAC, general) ? GENERAL    Julaine Hua 05/28/2018, 9:06 AM  _________________________________________________________________   (provider comments below)

## 2018-05-28 NOTE — Telephone Encounter (Signed)
Yes he can hold ASA a week prior and post knee replacement

## 2018-05-28 NOTE — Telephone Encounter (Signed)
   Primary Cardiologist: Tobias Alexander, MD  Chart reviewed as part of pre-operative protocol coverage. Patient was contacted 05/28/2018 in reference to pre-operative risk assessment for pending surgery as outlined below. She has a prior h/o systolic HF. EF 20-25% in 06/2015 but improved to normal, 50-55% in 09/2015. Cardiac cath 06/2015 showed moderate disease in the mid RCA, 65% stenosis, negative by FFR testing. No significant disease elsewhere.  Jennifer Kaufman was last seen on 04/15/2018 by Dr. Delton See.  Since that day, Jennifer Kaufman has done well. She denies anginal symptoms. She is able to ambulate a flight of stairs, 4 METs of physical activity, without exertional chest pain or dyspnea.    According to the Revised Cardiac Risk Index (RCRI), her perioperative risk of major cardiac events is 0.9%  Therefore, based on ACC/AHA guidelines, the patient would be at acceptable risk for the planned procedure without further cardiovascular testing.   I will route to Dr. Delton See to see if ok to hold ASA for surgery.   Robbie Lis, PA-C 05/28/2018, 9:39 AM

## 2018-06-01 ENCOUNTER — Telehealth: Payer: Self-pay | Admitting: Cardiology

## 2018-06-01 DIAGNOSIS — N183 Chronic kidney disease, stage 3 unspecified: Secondary | ICD-10-CM

## 2018-06-01 DIAGNOSIS — R600 Localized edema: Secondary | ICD-10-CM

## 2018-06-01 DIAGNOSIS — I5043 Acute on chronic combined systolic (congestive) and diastolic (congestive) heart failure: Secondary | ICD-10-CM

## 2018-06-01 MED ORDER — FUROSEMIDE 40 MG PO TABS: 40.0000 mg | ORAL_TABLET | Freq: Every day | ORAL | 3 refills | Status: DC

## 2018-06-01 NOTE — Telephone Encounter (Signed)
New Message   Pt c/o swelling: STAT is pt has developed SOB within 24 hours  1) How much weight have you gained and in what time span? Patient not sure  2) If swelling, where is the swelling located? Knee going down to her ankle on both legs but the right leg has more swelling  3) Are you currently taking a fluid pill? Patient has been taking off of lasix  4) Are you currently SOB? NO, but when she stands to quickly she gets dizzy   5) Do you have a log of your daily weights (if so, list)? No haven't been taking them   6) Have you gained 3 pounds in a day or 5 pounds in a week? No sure   7) Have you traveled recently? NO

## 2018-06-01 NOTE — Telephone Encounter (Addendum)
   Dr Delton See, pt called in with complaints of lower extremity edema and feeling light-headed with positional changes. She states she is experiencing lower extremity edema to both legs and ankles, however more in the right leg.  Pt was taken off of lasix at her 04/15/18 OV due to acute on chronic kidney failure, and bump in creatinine, and she was not having any edema at that time.   Pt had repeat labs done at that OV on 04/15/18, and her kidney function showed some mild improvement, and all other labs were normal.   Now since we have continued holding her lasix, she has called into the office today with complaints of bilateral lower extremity edema, and when she stands to quickly, she is getting dizzy.  Pt is not weighing herself daily. Pt states that her BPs have been running good when she checks them once a day.  Pt states her pressures stay around 140s systolic and 70s diastolic.  Pt states positional changes sometimes make her dizzy, but she has not check her pressures when this occurs.  Pt has no complaints of sob, chest pain, or any other cardiac complaints.  Advised the pt for her lower extremity edema, she should avoid salt, elevate her lower extremities while at rest, weigh herself by dry weight daily if she feels her weights are increasing, also advised the pt to wear her compression stockings while she is up and moving around during the day.  For her positional dizziness issue I advised her on how to correctly change positions slowly, while allowing her blood to circulate.  Advised the pt that she may need to hydrate better as well, with water.  Informed the pt that I will route her concerns to Dr Delton See for further review and recommendation, and follow-up with the pt thereafter.  Pt verbalized understanding and agrees with this plan.

## 2018-06-01 NOTE — Telephone Encounter (Signed)
I would start lasix 40 mg po daily, can she come for BNP and BMP tomorrow?

## 2018-06-01 NOTE — Telephone Encounter (Signed)
Spoke with the pt and informed her that per Dr Delton See, she recommends that we can start her on lasix 40 mg po daily, but we will need to have her come in for blood work tomorrow, to check a BMET and Pro-BNP.  Confirmed the pharmacy of choice with the pt.  Scheduled the pt for a lab appt for tomorrow 06/02/18, to check a bmet and pro-bnp.  Pt verbalized understanding and agrees with this plan.

## 2018-06-01 NOTE — Telephone Encounter (Signed)
Follow up:   Patient returning a call back from earlier. Please call patient back.

## 2018-06-02 ENCOUNTER — Other Ambulatory Visit: Payer: Medicare Other | Admitting: *Deleted

## 2018-06-02 ENCOUNTER — Other Ambulatory Visit: Payer: Self-pay

## 2018-06-02 DIAGNOSIS — R600 Localized edema: Secondary | ICD-10-CM

## 2018-06-02 DIAGNOSIS — N183 Chronic kidney disease, stage 3 unspecified: Secondary | ICD-10-CM

## 2018-06-02 DIAGNOSIS — I5043 Acute on chronic combined systolic (congestive) and diastolic (congestive) heart failure: Secondary | ICD-10-CM

## 2018-06-02 LAB — BASIC METABOLIC PANEL
BUN/Creatinine Ratio: 16 (ref 12–28)
BUN: 21 mg/dL (ref 8–27)
CO2: 22 mmol/L (ref 20–29)
Calcium: 10.3 mg/dL (ref 8.7–10.3)
Chloride: 102 mmol/L (ref 96–106)
Creatinine, Ser: 1.31 mg/dL — ABNORMAL HIGH (ref 0.57–1.00)
GFR calc Af Amer: 47 mL/min/{1.73_m2} — ABNORMAL LOW (ref >59)
GFR calc non Af Amer: 40 mL/min/{1.73_m2} — ABNORMAL LOW (ref >59)
Glucose: 126 mg/dL — ABNORMAL HIGH (ref 65–99)
Potassium: 3.9 mmol/L (ref 3.5–5.2)
Sodium: 142 mmol/L (ref 134–144)

## 2018-06-02 LAB — PRO B NATRIURETIC PEPTIDE: NT-Pro BNP: 726 pg/mL — ABNORMAL HIGH (ref 0–301)

## 2018-06-17 ENCOUNTER — Other Ambulatory Visit: Payer: Self-pay | Admitting: Physician Assistant

## 2018-06-17 DIAGNOSIS — I5032 Chronic diastolic (congestive) heart failure: Secondary | ICD-10-CM

## 2018-06-17 DIAGNOSIS — N179 Acute kidney failure, unspecified: Secondary | ICD-10-CM

## 2018-06-17 DIAGNOSIS — I2510 Atherosclerotic heart disease of native coronary artery without angina pectoris: Secondary | ICD-10-CM

## 2018-06-17 DIAGNOSIS — N189 Chronic kidney disease, unspecified: Secondary | ICD-10-CM

## 2018-06-21 ENCOUNTER — Telehealth: Payer: Self-pay | Admitting: Cardiology

## 2018-06-21 NOTE — Telephone Encounter (Signed)
New Message           Patient is needing to get a list of her current medication. Pls call to advise.

## 2018-06-21 NOTE — Telephone Encounter (Signed)
Pts daughter was calling in to go over the pts medication list, to confirm she is taking the correct meds and dosages.  Went over the pts entire medication list with the daughter and pt on the phone, and they wrote these down as I called them out. My list and the pts list both matched up perfectly, and the pt is taking all her meds correctly.  Both the pt and daughter verbalized understanding and was very gracious for all the assistance provided.

## 2018-07-14 ENCOUNTER — Other Ambulatory Visit: Payer: Self-pay | Admitting: Cardiology

## 2018-07-14 DIAGNOSIS — R600 Localized edema: Secondary | ICD-10-CM

## 2018-07-14 DIAGNOSIS — I5043 Acute on chronic combined systolic (congestive) and diastolic (congestive) heart failure: Secondary | ICD-10-CM

## 2018-07-14 DIAGNOSIS — N183 Chronic kidney disease, stage 3 unspecified: Secondary | ICD-10-CM

## 2018-07-14 MED ORDER — FUROSEMIDE 40 MG PO TABS: 40.0000 mg | ORAL_TABLET | Freq: Every day | ORAL | 8 refills | Status: DC

## 2018-09-05 ENCOUNTER — Other Ambulatory Visit: Payer: Self-pay | Admitting: Cardiology

## 2018-09-05 DIAGNOSIS — N179 Acute kidney failure, unspecified: Secondary | ICD-10-CM

## 2018-09-05 DIAGNOSIS — I5032 Chronic diastolic (congestive) heart failure: Secondary | ICD-10-CM

## 2018-09-05 DIAGNOSIS — I2510 Atherosclerotic heart disease of native coronary artery without angina pectoris: Secondary | ICD-10-CM

## 2018-09-07 ENCOUNTER — Other Ambulatory Visit: Payer: Self-pay | Admitting: Cardiology

## 2018-09-07 DIAGNOSIS — I2510 Atherosclerotic heart disease of native coronary artery without angina pectoris: Secondary | ICD-10-CM

## 2018-09-07 DIAGNOSIS — N179 Acute kidney failure, unspecified: Secondary | ICD-10-CM

## 2018-09-07 DIAGNOSIS — I5032 Chronic diastolic (congestive) heart failure: Secondary | ICD-10-CM

## 2018-09-07 DIAGNOSIS — N189 Chronic kidney disease, unspecified: Secondary | ICD-10-CM

## 2018-09-07 MED ORDER — METOPROLOL SUCCINATE ER 25 MG PO TB24: 12.5000 mg | ORAL_TABLET | Freq: Every day | ORAL | 6 refills | Status: DC

## 2018-09-14 ENCOUNTER — Other Ambulatory Visit: Payer: Self-pay | Admitting: Cardiology

## 2018-11-02 ENCOUNTER — Telehealth: Payer: Self-pay | Admitting: Cardiology

## 2018-11-02 ENCOUNTER — Other Ambulatory Visit: Payer: Self-pay

## 2018-11-02 ENCOUNTER — Encounter (INDEPENDENT_AMBULATORY_CARE_PROVIDER_SITE_OTHER): Payer: Self-pay

## 2018-11-02 ENCOUNTER — Ambulatory Visit (INDEPENDENT_AMBULATORY_CARE_PROVIDER_SITE_OTHER): Payer: Medicare Other | Admitting: Cardiology

## 2018-11-02 ENCOUNTER — Encounter: Payer: Self-pay | Admitting: Cardiology

## 2018-11-02 VITALS — BP 138/78 | HR 67 | Ht 61.5 in | Wt 182.1 lb

## 2018-11-02 DIAGNOSIS — R9431 Abnormal electrocardiogram [ECG] [EKG]: Secondary | ICD-10-CM | POA: Diagnosis not present

## 2018-11-02 DIAGNOSIS — I2510 Atherosclerotic heart disease of native coronary artery without angina pectoris: Secondary | ICD-10-CM | POA: Diagnosis not present

## 2018-11-02 DIAGNOSIS — I10 Essential (primary) hypertension: Secondary | ICD-10-CM | POA: Diagnosis not present

## 2018-11-02 DIAGNOSIS — Z79899 Other long term (current) drug therapy: Secondary | ICD-10-CM

## 2018-11-02 NOTE — Progress Notes (Signed)
11/02/2018 Jennifer Kaufman   1946/02/18  [id number]  Primary Physician Sol Passer, Doris Cheadle, MD Primary Cardiologist: Tobias Alexander, MD  Electrophysiologist: None   Reason for Visit/CC: F/u for Chronic Diastolic HF  HPI:  Jennifer Kaufman is a 73 y.o. female, followed by Dr. Delton See,  with history of hypertension, HLD, DM, h/o combined systolic and diastolic CHF w/ prior LVEF at 08-13% and mild renal insufficiency.  Follow-up echo 06/2015 showed improvement w/ LVEF back to normal range at 50-55%.  She has nonobstructive CAD with 65% mid RCA FFR non-physiologically significant, on cath 06/2015.  In 2019, her lasix was discontinued due to rise in SCr, up to 1.63. However Lasix was recently resumed this past March due to development of LEE and elevated BNP at 726 c/w acute CHF. Her SCr was 1.3 in March. She was advised to restart 40 mg of Lasix daily. She had improvement after restarting diuretic. She now presents to clinic for f/u.   She reports that she has been doing fairly well from a cardiac standpoint. She denies CP and dyspnea. No further LEE. Compliant with meds. Tolerating well w/o side effects. She was supposed to have right knee surgery this past spring but due to the pandemic it got canceled. She is not sure if she will plan to reschedule as she is fairing off pretty well w/o any significant knee pain currently.    Cardiac Studies  2D Echo 2017 Study Conclusions  - Left ventricle: The cavity size was mildly dilated. Wall   thickness was increased in a pattern of moderate LVH. Systolic   function was normal. The estimated ejection fraction was in the   range of 50% to 55%. Left ventricular diastolic function   parameters were normal. - Aortic valve: There was mild regurgitation. - Mitral valve: There was mild regurgitation. - Atrial septum: No defect or patent foramen ovale was identified.  R/LHC 2017 Procedures  Right/Left Heart Cath and Coronary Angiography  Conclusion  1. Mid  RCA lesion, 65% stenosed. FFR non-physiologically significant 2. No severe cardiomyopathy, likely nonischemic 3. Elevated LVEDP is consistent with Pulmonary Capillary Wedge Pressure monitoring.   Nonobstructive RCA lesion by FFR Nonischemic Cardiomyopathy.       Current Meds  Medication Sig  . aspirin EC 81 MG EC tablet Take 1 tablet (81 mg total) by mouth daily.  . furosemide (LASIX) 40 MG tablet Take 1 tablet (40 mg total) by mouth daily.  . metoprolol succinate (TOPROL XL) 25 MG 24 hr tablet Take 0.5 tablets (12.5 mg total) by mouth daily.  . nitroGLYCERIN (NITROSTAT) 0.4 MG SL tablet DISSOLVE 1 TABLET UNDER THE TONGUE EVERY 5 MINUTES AS NEEDED FOR CHEST PAIN  . rosuvastatin (CRESTOR) 20 MG tablet Take 20 mg by mouth daily.  Marland Kitchen spironolactone (ALDACTONE) 25 MG tablet TAKE 1 TABLET(25 MG) BY MOUTH TWICE DAILY  . ULORIC 40 MG tablet Take 40 mg by mouth daily.    Allergies  Allergen Reactions  . Atorvastatin     Other reaction(s): GI Upset (intolerance) Chest discomfort  . Carvedilol Other (See Comments)    myalgias  . Hydrochlorothiazide Other (See Comments)    hypokalemia  . Lisinopril Cough   Past Medical History:  Diagnosis Date  . Anemia   . Arthritis    "all over"  . CHF (congestive heart failure) (HCC) dx'd 06/22/2015  . Gout   . High cholesterol   . History of blood transfusion    'w/hysterectomy"  . Hypertension   .  Nonischemic cardiomyopathy (HCC)    a. echo 06/22/15 EF of 20-25% with akinesis of the anterior wall b. R & L cath 06/25/15 -nonobstructive RCA (65%) lesion by FFR, Elevated LVEDP --> medical managment   . Type II diabetes mellitus (HCC)    Family History  Problem Relation Age of Onset  . Hypertension Mother    Past Surgical History:  Procedure Laterality Date  . ABDOMINAL HYSTERECTOMY    . CARDIAC CATHETERIZATION N/A 06/25/2015   Procedure: Right/Left Heart Cath and Coronary Angiography;  Surgeon: Marykay Lexavid W Harding, MD;  Location[facility]VASIVE CV  LAB;  Service: Cardiovascular;  Laterality: N/A;  . CARPAL TUNNEL RELEASE Bilateral   . JOINT REPLACEMENT    . MENISCUS REPAIR Left    "scoped"  . THYROID LOBECTOMY    . TOTAL HIP ARTHROPLASTY Left 2000  . TUBAL LIGATION     Social History   Socioeconomic History  . Marital status: Widowed    Spouse name: Not on file  . Number of children: Not on file  . Years of education: Not on file  . Highest education level: Not on file  Occupational History  . Not on file  Social Needs  . Financial resource strain: Not on file  . Food insecurity    Worry: Not on file    Inability: Not on file  . Transportation needs    Medical: Not on file    Non-medical: Not on file  Tobacco Use  . Smoking status: Never Smoker  . Smokeless tobacco: Current User    Types: Snuff  Substance and Sexual Activity  . Alcohol use: No  . Drug use: No  . Sexual activity: Never  Lifestyle  . Physical activity    Days per week: Not on file    Minutes per session: Not on file  . Stress: Not on file  Relationships  . Social Musicianconnections    Talks on phone: Not on file    Gets together: Not on file    Attends religious service: Not on file    Active member of club or organization: Not on file    Attends meetings of clubs or organizations: Not on file    Relationship status: Not on file  . Intimate partner violence    Fear of current or ex partner: Not on file    Emotionally abused: Not on file    Physically abused: Not on file    Forced sexual activity: Not on file  Other Topics Concern  . Not on file  Social History Narrative  . Not on file     Lipid Panel     Component Value Date/Time   CHOL 181 05/12/2017 0826   TRIG 159 (H) 05/12/2017 0826   HDL 34 (L) 05/12/2017 0826   CHOLHDL 5.3 (H) 05/12/2017 0826   CHOLHDL 3.6 06/23/2015 0336   VLDL 23 06/23/2015 0336   LDLCALC 115 (H) 05/12/2017 0826    Review of Systems: General: negative for chills, fever, night sweats or weight changes.   Cardiovascular: negative for chest pain, dyspnea on exertion, edema, orthopnea, palpitations, paroxysmal nocturnal dyspnea or shortness of breath Dermatological: negative for rash Respiratory: negative for cough or wheezing Urologic: negative for hematuria Abdominal: negative for nausea, vomiting, diarrhea, bright red blood per rectum, melena, or hematemesis Neurologic: negative for visual changes, syncope, or dizziness All other systems reviewed and are otherwise negative except as noted above.   Physical Exam:  Blood pressure 138/78, pulse 67, height 5' 1.5" (1.562  m), weight 182 lb 1.9 oz (82.6 kg), SpO2 97 %.  General appearance: alert, cooperative and no distress Neck: no carotid bruit and no JVD Lungs: clear to auscultation bilaterally Heart: regular rate and rhythm, S1, S2 normal, no murmur, click, rub or gallop Extremities: extremities normal, atraumatic, no cyanosis or edema Pulses: 2+ and symmetric Skin: Skin color, texture, turgor normal. No rashes or lesions Neurologic: Grossly normal  EKG NSR, LAD, 67 bpm, inferolateral ST depressions -- personally reviewed   ASSESSMENT AND PLAN:   1. CAD: LHC in 2017 showed 65% mid RCA lesion, nonobstructive with negative FFR testing. She denies any significant CP or dyspnea but does not really push herself. She has chronic right sided knee pain, which limits some of her activity. Her EKG shows some new inferolateral T wave/ ST abnormalities concerning for ischemia. Given known RCA disease, ? disease progression and Ischemia in the distal inferolateral territory. I have recommend nuclear stress test.  Recommend continuation of aggressive lipid lowering therapy for LDL reduction to < 70 mg/dL and for plaque stabilization to reduce risk of future ACS. Continue ASA and  blocker.  2. Chronic Diastolic HF: last echo in 2017 showed normal LVEF . Euvolemic on exam. She recently restarted Lasix for edema and has CKD. Also on spironolactone. We will  check BMP today to check renal fx and electrolytes.    3. HTN: controlled on current regimen, 138/78.   4. CKD, Stage III: on daily diuretic therapy w/ Lasix.   5. HLD: LDL goal given CAD < 70 mg/dL. Her PCP, Dr. Garlon Hatchet, recently checked labs (in [place]). LDL was well controlled at 65 mg/dL. HFTs normal. Continue current regimen.    Follow-Up in 6  months f/u with Dr. Meda Coffee. If stress test is abnormal, will f/u sooner.   Corrie Brannen Ladoris Gene, MHS [facility] 11/02/2018 1:50 PM

## 2018-11-02 NOTE — Patient Instructions (Signed)
Medication Instructions:  NONE If you need a refill on your cardiac medications before your next appointment, please call your pharmacy.   Lab work:TODAY BMET If you have labs (blood work) drawn today and your tests are completely normal, you will receive your results only by: Marland Kitchen MyChart Message (if you have MyChart) OR . A paper copy in the mail If you have any lab test that is abnormal or we need to change your treatment, we will call you to review the results.  Testing/Procedures: PLEASE SCHEDULE Your physician has requested that you have a lexiscan myoview. For further information please visit [email or web address]. Please follow instruction sheet, as given.    Follow-Up: At [facility], you and your health needs are our priority.  As part of our continuing mission to provide you with exceptional heart care, we have created designated Provider Care Teams.  These Care Teams include your primary Cardiologist (physician) and Advanced Practice Providers (APPs -  Physician Assistants and Nurse Practitioners) who all work together to provide you with the care you need, when you need it. You will need a follow up appointment in 6 months.  Please call our office 2 months in advance to schedule this appointment.  You may see Ena Dawley, MD or one of the following Advanced Practice Providers on your designated Care Team:   [place], PA-C Melina Copa, PA-C . Ermalinda Barrios, PA-C  Any Other Special Instructions Will Be Listed Below (If Applicable).

## 2018-11-02 NOTE — Telephone Encounter (Signed)
Lyda Jester, PA has discussed cataract surgery with the patient.

## 2018-11-02 NOTE — Telephone Encounter (Signed)
New Message:  Pt  Has an appt today at 1:30 with [name]. Daughter wants to make sure that Jennifer Kaufman knows ot is having Cataract Eye Surgery on 11-09-18.! And 11-30-18.

## 2018-11-03 LAB — BASIC METABOLIC PANEL
BUN/Creatinine Ratio: 12 (ref 12–28)
BUN: 17 mg/dL (ref 8–27)
CO2: 27 mmol/L (ref 20–29)
Calcium: 10.2 mg/dL (ref 8.7–10.3)
Chloride: 100 mmol/L (ref 96–106)
Creatinine, Ser: 1.39 mg/dL — ABNORMAL HIGH (ref 0.57–1.00)
GFR calc Af Amer: 43 mL/min/{1.73_m2} — ABNORMAL LOW (ref >59)
GFR calc non Af Amer: 37 mL/min/{1.73_m2} — ABNORMAL LOW (ref >59)
Glucose: 133 mg/dL — ABNORMAL HIGH (ref 65–99)
Potassium: 3.5 mmol/L (ref 3.5–5.2)
Sodium: 143 mmol/L (ref 134–144)

## 2018-11-04 ENCOUNTER — Telehealth: Payer: Self-pay

## 2018-11-04 NOTE — Telephone Encounter (Signed)
-----   Message from Consuelo Pandy, [place] sent at 11/03/2018  5:36 PM EDT ----- Labs are stable. No changes. Continue current medications. No changes.

## 2018-11-04 NOTE — Telephone Encounter (Signed)
Notes recorded by Frederik Schmidt, RN on 11/04/2018 at 8:18 AM EDT  The patient has been notified of the result and verbalized understanding. All questions (if any) were answered.  Frederik Schmidt, RN 11/04/2018 8:18 AM

## 2018-11-08 ENCOUNTER — Telehealth (HOSPITAL_COMMUNITY): Payer: Self-pay

## 2018-11-08 NOTE — Telephone Encounter (Signed)
Spoke with the patient and her daughter on speaker phone. Both given the instructions and stated that she would be there. Asked to call back with any questions. S.Ketty Bitton EMTP

## 2018-11-11 ENCOUNTER — Other Ambulatory Visit: Payer: Self-pay

## 2018-11-11 ENCOUNTER — Ambulatory Visit (HOSPITAL_COMMUNITY): Payer: Medicare Other | Attending: Cardiology

## 2018-11-11 DIAGNOSIS — R9431 Abnormal electrocardiogram [ECG] [EKG]: Secondary | ICD-10-CM | POA: Insufficient documentation

## 2018-11-11 LAB — MYOCARDIAL PERFUSION IMAGING
LV dias vol: 64 mL (ref 46–106)
LV sys vol: 37 mL
Peak HR: 95 {beats}/min
Rest HR: 60 {beats}/min
SDS: 0
SRS: 0
SSS: 0
TID: 0.86

## 2018-11-11 MED ORDER — REGADENOSON 0.4 MG/5ML IV SOLN
0.4000 mg | Freq: Once | INTRAVENOUS | Status: AC
  Administered 2018-11-11: 0.4 mg via INTRAVENOUS

## 2018-11-11 MED ORDER — TECHNETIUM TC 99M TETROFOSMIN IV KIT
9.8000 | PACK | Freq: Once | INTRAVENOUS | Status: AC | PRN
  Administered 2018-11-11: 9.8 via INTRAVENOUS
  Filled 2018-11-11: qty 10

## 2018-11-11 MED ORDER — TECHNETIUM TC 99M TETROFOSMIN IV KIT
31.1000 | PACK | Freq: Once | INTRAVENOUS | Status: AC | PRN
  Administered 2018-11-11: 31.1 via INTRAVENOUS
  Filled 2018-11-11: qty 32

## 2018-11-16 ENCOUNTER — Telehealth: Payer: Self-pay

## 2018-11-16 NOTE — Telephone Encounter (Signed)
Notes recorded by Frederik Schmidt, RN on 11/16/2018 at 8:27 AM EDT  The patient has been notified of the result and verbalized understanding. All questions (if any) were answered.  Frederik Schmidt, RN 11/16/2018 8:27 AM

## 2018-11-16 NOTE — Telephone Encounter (Signed)
-----   Message from Consuelo Pandy, [place] sent at 11/15/2018 11:15 AM EDT ----- Stress test low risk for ischemia (abnormal blood flow). No findings suggestive of major blockages in heart vessels. Ok to proceed with knee surgery.

## 2018-12-08 ENCOUNTER — Other Ambulatory Visit: Payer: Self-pay | Admitting: Cardiology

## 2018-12-08 DIAGNOSIS — N189 Chronic kidney disease, unspecified: Secondary | ICD-10-CM

## 2018-12-08 DIAGNOSIS — I5032 Chronic diastolic (congestive) heart failure: Secondary | ICD-10-CM

## 2018-12-08 DIAGNOSIS — I2510 Atherosclerotic heart disease of native coronary artery without angina pectoris: Secondary | ICD-10-CM

## 2018-12-08 DIAGNOSIS — N179 Acute kidney failure, unspecified: Secondary | ICD-10-CM

## 2019-03-19 ENCOUNTER — Other Ambulatory Visit: Payer: Self-pay | Admitting: Cardiology

## 2019-03-19 DIAGNOSIS — N179 Acute kidney failure, unspecified: Secondary | ICD-10-CM

## 2019-03-19 DIAGNOSIS — I2510 Atherosclerotic heart disease of native coronary artery without angina pectoris: Secondary | ICD-10-CM

## 2019-03-19 DIAGNOSIS — I5032 Chronic diastolic (congestive) heart failure: Secondary | ICD-10-CM

## 2019-03-26 ENCOUNTER — Other Ambulatory Visit: Payer: Self-pay | Admitting: Cardiology

## 2019-03-26 DIAGNOSIS — I5043 Acute on chronic combined systolic (congestive) and diastolic (congestive) heart failure: Secondary | ICD-10-CM

## 2019-03-26 DIAGNOSIS — R600 Localized edema: Secondary | ICD-10-CM

## 2019-03-26 DIAGNOSIS — N183 Chronic kidney disease, stage 3 unspecified: Secondary | ICD-10-CM

## 2019-04-22 ENCOUNTER — Ambulatory Visit: Payer: Medicare Other | Admitting: Physician Assistant

## 2019-04-24 ENCOUNTER — Encounter: Payer: Self-pay | Admitting: Physician Assistant

## 2019-04-24 NOTE — Progress Notes (Signed)
Cardiology Office Note    Date:  04/25/2019   ID:  Kaufman, Jennifer 18-Aug-1945, MRN [id number]  PCP:  Olive Bass, MD  Cardiologist:  Tobias Alexander, MD  Electrophysiologist:  None  Nephrologist:  Sabra Heck, MD  Chief Complaint: routine f/u CHF   History of Present Illness:   Jennifer Kaufman is a 74 y.o. female with history of hypertension, HLD, DM, chronic combined systolic and diastolic CHF/NICM, nonobstructive CAD, mild AI/MR, CKD stage III who presents for 6 month follow-up. She has history of echocardiogram in 06/2015 with EF 20-25% with diffuse hypokinesis. [facility] 06/2015 showed 65% mRCA lesion (not physiologically significant by FFR), elevated LVEDP, NICM. She was treated with medical therapy. 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 She saw Robbie Lis in follow-up in 10/2018 with abnormal EKG, prompting stress test. This was done 11/11/18 showing no ischemia, but prior infarct and EF 43%. Has not had repeat echo since that time. Last labs personally reviewed from 10/2018 showed K 3.5, Cr 1.39, 03/2018 Hgb 12.1, TSH wnl, LFTs ok, 04/2017 LDL 115. Possible allergy to atorvastatin in chart, patient unclear on reaction. At some point she was on valsartan than irbesartan then disappeared from medicine list without obvious reason for discontinuation. In 2019 she did have issues with orthostatic hypotension and AKI with Cr up to 2.3 prompting adjustment in diuretics, although I do not see any specific commentary about ARB at that time. The patient herself does not recall either.  She presents for routine follow-up today feeling well without acute complaint. She has chronic NYHA class II dyspnea when exerting herself at higher level of activities, but simply prefers to avoid doing so so does not notice any specific symptoms in her every day life. She has a rare flutter lasting a second or two but this is very  infrequent. She denies any edema, orthopnea or syncope. Denies h/o viral illness preceding her CHF diagnosis. No ETOH. Her mother had some sort of heart problem but is stil living in her 57s. Otherwise, no significant hx of heart disease. She saw her nephrologist last week and had bloodwork done. We do not have those results today but per calling to their office, they did CBC, CMET and urinalysis.   Past Medical History:  Diagnosis Date  . Anemia   . Arthritis    "all over"  . CAD (coronary artery disease)    a. 65% RCA lesion in 2017 cath, nonobstructive.  . Chronic combined systolic and diastolic CHF (congestive heart failure) (HCC)   . CKD (chronic kidney disease), stage III   . Gout   . High cholesterol   . History of blood transfusion    'w/hysterectomy"  . Hypertension   . Mild aortic insufficiency   . Mild mitral regurgitation   . Nonischemic cardiomyopathy (HCC)    a. echo 06/22/15 EF of 20-25% with akinesis of the anterior wall b. R & L cath 06/25/15 -nonobstructive RCA (65%) lesion by FFR, Elevated LVEDP --> medical managment   . Type II diabetes mellitus (HCC)     Past Surgical History:  Procedure Laterality Date  . ABDOMINAL HYSTERECTOMY    . CARDIAC CATHETERIZATION N/A 06/25/2015   Procedure: Right/Left Heart Cath and Coronary Angiography;  Surgeon: Marykay Lex, MD;  Location: [facility] INVASIVE CV LAB;  Service: Cardiovascular;  Laterality: N/A;  . CARPAL TUNNEL RELEASE Bilateral   . JOINT REPLACEMENT    .  MENISCUS REPAIR Left    "scoped"  . THYROID LOBECTOMY    . TOTAL HIP ARTHROPLASTY Left 2000  . TUBAL LIGATION      Current Medications: Current Meds  Medication Sig  . aspirin EC 81 MG EC tablet Take 1 tablet (81 mg total) by mouth daily.  . furosemide (LASIX) 40 MG tablet TAKE 1 TABLET(40 MG) BY MOUTH DAILY  . metoprolol succinate (TOPROL-XL) 25 MG 24 hr tablet TAKE 1/2 TABLET(12.5 MG) BY MOUTH DAILY  . nitroGLYCERIN (NITROSTAT) 0.4 MG SL tablet DISSOLVE 1 TABLET  UNDER THE TONGUE EVERY 5 MINUTES AS NEEDED FOR CHEST PAIN  . rosuvastatin (CRESTOR) 20 MG tablet Take 20 mg by mouth daily.  Marland Kitchen spironolactone (ALDACTONE) 25 MG tablet TAKE 1 TABLET(25 MG) BY MOUTH TWICE DAILY  . ULORIC 40 MG tablet Take 40 mg by mouth daily.      Allergies:   Atorvastatin, Carvedilol, Hydrochlorothiazide, and Lisinopril   Social History   Socioeconomic History  . Marital status: Widowed    Spouse name: Not on file  . Number of children: Not on file  . Years of education: Not on file  . Highest education level: Not on file  Occupational History  . Not on file  Tobacco Use  . Smoking status: Never Smoker  . Smokeless tobacco: Current User    Types: Snuff  Substance and Sexual Activity  . Alcohol use: No  . Drug use: No  . Sexual activity: Never  Other Topics Concern  . Not on file  Social History Narrative  . Not on file   Social Determinants of Health   Financial Resource Strain:   . Difficulty of Paying Living Expenses: Not on file  Food Insecurity:   . Worried About Charity fundraiser in the Last Year: Not on file  . Ran Out of Food in the Last Year: Not on file  Transportation Needs:   . Lack of Transportation (Medical): Not on file  . Lack of Transportation (Non-Medical): Not on file  Physical Activity:   . Days of Exercise per Week: Not on file  . Minutes of Exercise per Session: Not on file  Stress:   . Feeling of Stress : Not on file  Social Connections:   . Frequency of Communication with Friends and Family: Not on file  . Frequency of Social Gatherings with Friends and Family: Not on file  . Attends Religious Services: Not on file  . Active Member of Clubs or Organizations: Not on file  . Attends Archivist Meetings: Not on file  . Marital Status: Not on file     Family History:  The patient's family history includes Hypertension in her mother.  ROS:   Please see the history of present illness. All other systems are  reviewed and otherwise negative.    EKGs/Labs/Other Studies Reviewed:    Studies reviewed are outlined and summarized above. Reports included below if pertinent.  2D Echo 09/2015  Study Conclusions  - Left ventricle: The cavity size was mildly dilated. Wall  thickness was increased in a pattern of moderate LVH. Systolic  function was normal. The estimated ejection fraction was in the  range of 50% to 55%. Left ventricular diastolic function  parameters were normal.  - Aortic valve: There was mild regurgitation.  - Mitral valve: There was mild regurgitation.  - Atrial septum: No defect or patent foramen ovale was identified.   2D echo 06/2015  - Left ventricle: The cavity size was  normal. Wall thickness was  increased in a pattern of mild LVH. Systolic function was  severely reduced. The estimated ejection fraction was in the  range of 20% to 25%. Diffuse hypokinesis. There is akinesis of  the anteroseptal and apical myocardium. Doppler parameters are  consistent with restrictive physiology, indicative of decreased  left ventricular diastolic compliance and/or increased left  atrial pressure.  - Aortic valve: There was mild regurgitation.  - Mitral valve: There was mild regurgitation.  - Pericardium, extracardiac: A small pericardial effusion was  identified. There was a left pleural effusion.  Impressions:  - Diffuse hypokinesis with akinesis of the anteroseptal wall and  apex; overall severely reduced LV function; restrictive filling;  mild AI and MR; small pericardial effusion with mild RA collapse.  R/LHC 2017  Procedures    Right/Left Heart Cath and Coronary Angiography  Conclusion    1. Mid RCA lesion, 65% stenosed. FFR non-physiologically significant  2. No severe cardiomyopathy, likely nonischemic  3. Elevated LVEDP is consistent with Pulmonary Capillary Wedge Pressure monitoring.    Nonobstructive RCA lesion by FFR  Nonischemic  Cardiomyopathy.      EKG:  EKG is ordered today, personally reviewed, demonstrating NSR 69bpm, nonspecific STT changes similar to prior (I, II, III avF, avL, V3-V6)  Recent Labs: 06/02/2018: NT-Pro BNP 726 11/02/2018: BUN 17; Creatinine, Ser 1.39; Potassium 3.5; Sodium 143  Recent Lipid Panel    Component Value Date/Time   CHOL 181 05/12/2017 0826   TRIG 159 (H) 05/12/2017 0826   HDL 34 (L) 05/12/2017 0826   CHOLHDL 5.3 (H) 05/12/2017 0826   CHOLHDL 3.6 06/23/2015 0336   VLDL 23 06/23/2015 0336   LDLCALC 115 (H) 05/12/2017 0826    PHYSICAL EXAM:    VS:  BP 132/72   Pulse 69   Ht 5\' 2"  (1.575 m)   Wt 180 lb (81.6 kg)   SpO2 98%   BMI 32.92 kg/m   BMI: Body mass index is 32.92 kg/m.  GEN: Well nourished, well developed AAF, in no acute distress HEENT: normocephalic, atraumatic Neck: no JVD, carotid bruits, or masses Cardiac: RRR; no murmurs, rubs, or gallops, no edema  Respiratory:  clear to auscultation bilaterally, normal work of breathing GI: soft, nontender, nondistended, + BS MS: no deformity or atrophy Skin: warm and dry, no rash Neuro:  Alert and Oriented x 3, Strength and sensation are intact, follows commands Psych: euthymic mood, full affect  Wt Readings from Last 3 Encounters:  04/25/19 180 lb (81.6 kg)  11/11/18 182 lb (82.6 kg)  11/02/18 182 lb 1.9 oz (82.6 kg)     ASSESSMENT & PLAN:   1. Chronic combined CHF/history of NICM - clinically appears euvolemic on exam. She reports stable NYHA class II dyspnea. Will see if we can get a copy of her most recent CMET and CBC. Of note, in 10/2018 she had a stress test that reported recurrent decline in LVEF to 43%. I will get an echocardiogram so that we can more definitively assess if there was a true recurrence of her cardiology. If so, we will need to think about reinitiation of ARB (but would await renal labs first). Would otherwise continue present regimen. 2. CAD, nonobstructive - no recent anginal symptoms.  Continue ASA, BB, statin. Will obtain FLP when she comes in for her echo. Await CMET results from nephrology. If there has been any further decline in renal function, may need to consider transition from Crestor to atorvastatin (if statin titration needed). 3. Essential  HTN - BP right around borderline controlled. She has rare orthostatic type dizziness when standing but it does not sound prohibitive of med adjustment if absolutely necessary. Will await echocardiogram and go from there. 4. Hyperlipidemia - Will obtain FLP when she comes in for her echo. Await CMET results from nephrology. If there has been any further decline in renal function, may need to consider transition from Crestor to atorvastatin (if statin titration needed). 5. 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  Disposition: F/u with Dr. Delton See in 6 months. If LVEF is in fact decreased from prior, will consider med titration with virtual follow-up thereafter.  Medication Adjustments/Labs and Tests Ordered: Current medicines are reviewed at length with the patient today.  Concerns regarding medicines are outlined above. Medication changes, Labs and Tests ordered today are summarized above and listed in the Patient Instructions accessible in Encounters.   Signed, Laurann Montana, PA-C  04/25/2019 2:20 PM    [facility] [address] [place], [place], [place]  [address] Phone: [phone]; Fax: [phone]

## 2019-04-25 ENCOUNTER — Encounter: Payer: Self-pay | Admitting: Physician Assistant

## 2019-04-25 ENCOUNTER — Other Ambulatory Visit: Payer: Self-pay

## 2019-04-25 ENCOUNTER — Ambulatory Visit (INDEPENDENT_AMBULATORY_CARE_PROVIDER_SITE_OTHER): Payer: Medicare Other | Admitting: Physician Assistant

## 2019-04-25 VITALS — BP 132/72 | HR 69 | Ht 62.0 in | Wt 180.0 lb

## 2019-04-25 DIAGNOSIS — E782 Mixed hyperlipidemia: Secondary | ICD-10-CM

## 2019-04-25 DIAGNOSIS — I5042 Chronic combined systolic (congestive) and diastolic (congestive) heart failure: Secondary | ICD-10-CM

## 2019-04-25 DIAGNOSIS — I2510 Atherosclerotic heart disease of native coronary artery without angina pectoris: Secondary | ICD-10-CM

## 2019-04-25 DIAGNOSIS — I340 Nonrheumatic mitral (valve) insufficiency: Secondary | ICD-10-CM

## 2019-04-25 DIAGNOSIS — I10 Essential (primary) hypertension: Secondary | ICD-10-CM

## 2019-04-25 DIAGNOSIS — I351 Nonrheumatic aortic (valve) insufficiency: Secondary | ICD-10-CM

## 2019-04-25 NOTE — Patient Instructions (Addendum)
Medication Instructions:  Your physician recommends that you continue on your current medications as directed. Please refer to the Current Medication list given to you today.   *If you need a refill on your cardiac medications before your next appointment, please call your pharmacy*  Lab Work: 05/04/2019:  COME TO THE OFFICE FOR FASTING LIPID PANEL  If you have labs (blood work) drawn today and your tests are completely normal, you will receive your results only by: Marland Kitchen MyChart Message (if you have MyChart) OR . A paper copy in the mail If you have any lab test that is abnormal or we need to change your treatment, we will call you to review the results.  Testing/Procedures: Your physician has requested that you have an echocardiogram 05/04/2019 ARRIVE AT 3:50 FOR REGISTRATION. Echocardiography is a painless test that uses sound waves to create images of your heart. It provides your doctor with information about the size and shape of your heart and how well your heart's chambers and valves are working. This procedure takes approximately one hour. There are no restrictions for this procedure.    Follow-Up: At [facility], you and your health needs are our priority.  As part of our continuing mission to provide you with exceptional heart care, we have created designated Provider Care Teams.  These Care Teams include your primary Cardiologist (physician) and Advanced Practice Providers (APPs -  Physician Assistants and Nurse Practitioners) who all work together to provide you with the care you need, when you need it.  Your next appointment:   6 month(s)  The format for your next appointment:   In Person  Provider:   You may see Tobias Alexander, MD or one of the following Advanced Practice Providers on your designated Care Team:    Ronie Spies, PA-C  Jacolyn Reedy, PA-C   Other Instructions  Echocardiogram An echocardiogram is a procedure that uses painless sound waves (ultrasound) to  produce an image of the heart. Images from an echocardiogram can provide important information about:  Signs of coronary artery disease (CAD).  Aneurysm detection. An aneurysm is a weak or damaged part of an artery wall that bulges out from the normal force of blood pumping through the body.  Heart size and shape. Changes in the size or shape of the heart can be associated with certain conditions, including heart failure, aneurysm, and CAD.  Heart muscle function.  Heart valve function.  Signs of a past heart attack.  Fluid buildup around the heart.  Thickening of the heart muscle.  A tumor or infectious growth around the heart valves. Tell a health care provider about:  Any allergies you have.  All medicines you are taking, including vitamins, herbs, eye drops, creams, and over-the-counter medicines.  Any blood disorders you have.  Any surgeries you have had.  Any medical conditions you have.  Whether you are pregnant or may be pregnant. What are the risks? Generally, this is a safe procedure. However, problems may occur, including:  Allergic reaction to dye (contrast) that may be used during the procedure. What happens before the procedure? No specific preparation is needed. You may eat and drink normally. What happens during the procedure?   An IV tube may be inserted into one of your veins.  You may receive contrast through this tube. A contrast is an injection that improves the quality of the pictures from your heart.  A gel will be applied to your chest.  A wand-like tool (transducer) will be moved  over your chest. The gel will help to transmit the sound waves from the transducer.  The sound waves will harmlessly bounce off of your heart to allow the heart images to be captured in real-time motion. The images will be recorded on a computer. The procedure may vary among health care providers and hospitals. What happens after the procedure?  You may return to  your normal, everyday life, including diet, activities, and medicines, unless your health care provider tells you not to do that. Summary  An echocardiogram is a procedure that uses painless sound waves (ultrasound) to produce an image of the heart.  Images from an echocardiogram can provide important information about the size and shape of your heart, heart muscle function, heart valve function, and fluid buildup around your heart.  You do not need to do anything to prepare before this procedure. You may eat and drink normally.  After the echocardiogram is completed, you may return to your normal, everyday life, unless your health care provider tells you not to do that. This information is not intended to replace advice given to you by your health care provider. Make sure you discuss any questions you have with your health care provider. Document Revised: 06/24/2018 Document Reviewed: 04/05/2016 Elsevier Patient Education  [place].

## 2019-05-04 ENCOUNTER — Other Ambulatory Visit: Payer: Self-pay

## 2019-05-04 ENCOUNTER — Other Ambulatory Visit: Payer: Medicare Other | Admitting: *Deleted

## 2019-05-04 ENCOUNTER — Ambulatory Visit (HOSPITAL_COMMUNITY): Payer: Medicare Other | Attending: Cardiology

## 2019-05-04 DIAGNOSIS — E782 Mixed hyperlipidemia: Secondary | ICD-10-CM | POA: Diagnosis not present

## 2019-05-04 DIAGNOSIS — I10 Essential (primary) hypertension: Secondary | ICD-10-CM | POA: Diagnosis not present

## 2019-05-04 DIAGNOSIS — I2510 Atherosclerotic heart disease of native coronary artery without angina pectoris: Secondary | ICD-10-CM

## 2019-05-04 DIAGNOSIS — I5042 Chronic combined systolic (congestive) and diastolic (congestive) heart failure: Secondary | ICD-10-CM

## 2019-05-04 DIAGNOSIS — I340 Nonrheumatic mitral (valve) insufficiency: Secondary | ICD-10-CM

## 2019-05-04 DIAGNOSIS — I351 Nonrheumatic aortic (valve) insufficiency: Secondary | ICD-10-CM | POA: Diagnosis present

## 2019-05-05 LAB — LIPID PANEL
Chol/HDL Ratio: 3.8 ratio (ref 0.0–4.4)
Cholesterol, Total: 133 mg/dL (ref 100–199)
HDL: 35 mg/dL — ABNORMAL LOW (ref >39)
LDL Chol Calc (NIH): 72 mg/dL (ref 0–99)
Triglycerides: 151 mg/dL — ABNORMAL HIGH (ref 0–149)
VLDL Cholesterol Cal: 26 mg/dL (ref 5–40)

## 2019-07-16 ENCOUNTER — Other Ambulatory Visit: Payer: Self-pay | Admitting: Cardiology

## 2019-07-16 DIAGNOSIS — N183 Chronic kidney disease, stage 3 unspecified: Secondary | ICD-10-CM

## 2019-07-16 DIAGNOSIS — I5043 Acute on chronic combined systolic (congestive) and diastolic (congestive) heart failure: Secondary | ICD-10-CM

## 2019-07-16 DIAGNOSIS — R600 Localized edema: Secondary | ICD-10-CM

## 2019-10-04 ENCOUNTER — Other Ambulatory Visit: Payer: Self-pay | Admitting: Cardiology

## 2019-10-04 DIAGNOSIS — I5032 Chronic diastolic (congestive) heart failure: Secondary | ICD-10-CM

## 2019-10-04 DIAGNOSIS — I2510 Atherosclerotic heart disease of native coronary artery without angina pectoris: Secondary | ICD-10-CM

## 2019-10-04 DIAGNOSIS — N179 Acute kidney failure, unspecified: Secondary | ICD-10-CM

## 2019-10-06 DIAGNOSIS — Z01818 Encounter for other preprocedural examination: Secondary | ICD-10-CM

## 2019-10-07 ENCOUNTER — Telehealth: Payer: Self-pay | Admitting: *Deleted

## 2019-10-07 NOTE — Telephone Encounter (Signed)
   [facility] Pre-operative Risk Assessment    HEARTCARE STAFF: - Please ensure there is not already an duplicate clearance open for this procedure. - Under Visit Info/Reason for Call, type in Other and utilize the format Clearance MM/DD/YY or Clearance TBD. Do not use dashes or single digits. - If request is for dental extraction, please clarify the # of teeth to be extracted.  Request for surgical clearance:  1. What type of surgery is being performed?  RIGHT TOTAL HIP ARTHROPLASTY   2. When is this surgery scheduled?  TBD   3. What type of clearance is required (medical clearance vs. Pharmacy clearance to hold med vs. Both)?  BOTH  4. Are there any medications that need to be held prior to surgery and how long? ASPIRIN   5. Practice name and name of physician performing surgery?  [facility] / DR. Littlerock   6. What is the office phone number?  [phone]   7.   What is the office fax number?  [phone]  8.   Anesthesia type (None, local, MAC, general) ?  GENERAL   Jennifer Kaufman 10/07/2019, 11:55 AM  _________________________________________________________________   (provider comments below)

## 2019-10-07 NOTE — Telephone Encounter (Signed)
Forwarded to requesting party via Epic fax function 

## 2019-10-07 NOTE — Telephone Encounter (Signed)
Called pt and scheduled appt for 11/02/2019 @ 8:45 AM with Chelsea Aus, PA

## 2019-10-07 NOTE — Telephone Encounter (Signed)
Primary Cardiologist:Katarina Delton See, MD  Chart reviewed as part of pre-operative protocol coverage. Because of Jennifer Kaufman's past medical history and time since last visit, he/she will require a follow-up visit in order to better assess preoperative cardiovascular risk.  Pre-op covering staff: - Please schedule appointment and call patient to inform them. - Please contact requesting surgeon's office via preferred method (i.e, phone, fax) to inform them of need for appointment prior to surgery.  If applicable, this message will also be routed to pharmacy pool and/or primary cardiologist for input on holding anticoagulant/antiplatelet agent as requested below so that this information is available at time of patient's appointment.   Ronney Asters, NP  10/07/2019, 12:28 PM

## 2019-11-02 ENCOUNTER — Encounter: Payer: Self-pay | Admitting: Physician Assistant

## 2019-11-02 ENCOUNTER — Ambulatory Visit (INDEPENDENT_AMBULATORY_CARE_PROVIDER_SITE_OTHER): Payer: Medicare Other | Admitting: Physician Assistant

## 2019-11-02 ENCOUNTER — Other Ambulatory Visit: Payer: Self-pay

## 2019-11-02 VITALS — BP 124/70 | HR 81 | Ht 62.0 in | Wt 172.6 lb

## 2019-11-02 DIAGNOSIS — N179 Acute kidney failure, unspecified: Secondary | ICD-10-CM

## 2019-11-02 DIAGNOSIS — I351 Nonrheumatic aortic (valve) insufficiency: Secondary | ICD-10-CM | POA: Diagnosis not present

## 2019-11-02 DIAGNOSIS — I10 Essential (primary) hypertension: Secondary | ICD-10-CM | POA: Diagnosis not present

## 2019-11-02 DIAGNOSIS — I5032 Chronic diastolic (congestive) heart failure: Secondary | ICD-10-CM

## 2019-11-02 DIAGNOSIS — I2510 Atherosclerotic heart disease of native coronary artery without angina pectoris: Secondary | ICD-10-CM

## 2019-11-02 DIAGNOSIS — N189 Chronic kidney disease, unspecified: Secondary | ICD-10-CM

## 2019-11-02 DIAGNOSIS — Z01818 Encounter for other preprocedural examination: Secondary | ICD-10-CM

## 2019-11-02 DIAGNOSIS — I5042 Chronic combined systolic (congestive) and diastolic (congestive) heart failure: Secondary | ICD-10-CM

## 2019-11-02 MED ORDER — METOPROLOL SUCCINATE ER 25 MG PO TB24: ORAL_TABLET | ORAL | 3 refills | Status: DC

## 2019-11-02 MED ORDER — NITROGLYCERIN 0.4 MG SL SUBL: SUBLINGUAL_TABLET | SUBLINGUAL | 3 refills | Status: DC

## 2019-11-02 NOTE — Progress Notes (Signed)
Cardiology Office Note    Date:  11/02/2019   ID:  Jennifer Kaufman, Jennifer Kaufman 1945-07-25, MRN [id number]  PCP:  Olive Bass, MD  Cardiologist: Dr. Delton See   Chief Complaint: surgical clearance for RIGHT TOTAL HIP ARTHROPLASTY   History of Present Illness:   TAMEIA Kaufman is a 74 y.o. 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  She has history of echocardiogram in 06/2015 with EF 20-25% with diffuse hypokinesis. [facility] 06/2015 showed 65% mRCA lesion (not physiologically significant by FFR), elevated LVEDP, NICM. She was treated with medical therapy. 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  Last stress test  11/11/18 showing no ischemia, but prior infarct and EF 43%. Last echo 04/2019 showed LVEF of 60-65% and grade 1 DD.   Patient is here for surgical clearance with daughter. No regular exercise but very active walking inside the house and in yard for 10 minutes at a time. Activity limited due to hip pain. Denies shortness of breath, chest pain, palpitation, orthopnea, PND, syncope or lower extremity edema.   Past Medical History:  Diagnosis Date  . Anemia   . Arthritis    "all over"  . CAD (coronary artery disease)    a. 65% RCA lesion in 2017 cath, nonobstructive.  . Chronic combined systolic and diastolic CHF (congestive heart failure) (HCC)   . CKD (chronic kidney disease), stage III   . Gout   . High cholesterol   . History of blood transfusion    'w/hysterectomy"  . Hypertension   . Mild aortic insufficiency   . Mild mitral regurgitation   . Nonischemic cardiomyopathy (HCC)    a. echo 06/22/15 EF of 20-25% with akinesis of the anterior wall b. R & L cath 06/25/15 -nonobstructive RCA (65%) lesion by FFR,  Elevated LVEDP --> medical managment   . Type II diabetes mellitus (HCC)     Past Surgical History:  Procedure Laterality Date  . ABDOMINAL HYSTERECTOMY    . CARDIAC CATHETERIZATION N/A 06/25/2015   Procedure: Right/Left Heart Cath and Coronary Angiography;  Surgeon: Marykay Lex, MD;  Location: [facility] INVASIVE CV LAB;  Service: Cardiovascular;  Laterality: N/A;  . CARPAL TUNNEL RELEASE Bilateral   . JOINT REPLACEMENT    . MENISCUS REPAIR Left    "scoped"  . THYROID LOBECTOMY    . TOTAL HIP ARTHROPLASTY Left 2000  . TUBAL LIGATION      Current Medications: Prior to Admission medications   Medication Sig Start Date End Date Taking? Authorizing Provider  aspirin EC 81 MG EC tablet Take 1 tablet (81 mg total) by mouth daily. 06/27/15   Little Ishikawa, NP  furosemide (LASIX) 40 MG tablet TAKE 1 TABLET(40 MG) BY MOUTH DAILY 07/18/19   Lars Masson, MD  metoprolol succinate (TOPROL-XL) 25 MG 24 hr tablet TAKE 1/2 TABLET(12.5 MG) BY MOUTH DAILY 10/04/19   Lars Masson, MD  nitroGLYCERIN (NITROSTAT) 0.4 MG SL tablet DISSOLVE 1 TABLET UNDER THE TONGUE EVERY 5 MINUTES AS NEEDED FOR CHEST PAIN 06/20/16   Lars Masson, MD  rosuvastatin (CRESTOR) 20 MG tablet Take 20 mg by mouth daily. 07/18/18   [provider]  spironolactone (ALDACTONE) 25 MG tablet TAKE 1 TABLET(25 MG) BY MOUTH TWICE DAILY 12/08/18   Lars Masson, MD  Thyra Breed  40 MG tablet Take 40 mg by mouth daily.  06/05/15   [provider]    Allergies:   Atorvastatin, Carvedilol, Hydrochlorothiazide, and Lisinopril   Social History   Socioeconomic History  . Marital status: Widowed    Spouse name: Not on file  . Number of children: Not on file  . Years of education: Not on file  . Highest education level: Not on file  Occupational History  . Not on file  Tobacco Use  . Smoking status: Never Smoker  . Smokeless tobacco: Current User    Types: Snuff  Vaping Use  . Vaping Use: Never used  Substance  and Sexual Activity  . Alcohol use: No  . Drug use: No  . Sexual activity: Never  Other Topics Concern  . Not on file  Social History Narrative  . Not on file   Social Determinants of Health   Financial Resource Strain:   . Difficulty of Paying Living Expenses:   Food Insecurity:   . Worried About Programme researcher, broadcasting/film/video in the Last Year:   . Barista in the Last Year:   Transportation Needs:   . Freight forwarder (Medical):   Jennifer Kaufman Lack of Transportation (Non-Medical):   Physical Activity:   . Days of Exercise per Week:   . Minutes of Exercise per Session:   Stress:   . Feeling of Stress :   Social Connections:   . Frequency of Communication with Friends and Family:   . Frequency of Social Gatherings with Friends and Family:   . Attends Religious Services:   . Active Member of Clubs or Organizations:   . Attends Banker Meetings:   Jennifer Kaufman Marital Status:      Family History:  The patient's family history includes Hypertension in her mother.   ROS:   Please see the history of present illness.    ROS All other systems reviewed and are negative.   PHYSICAL EXAM:   VS:  BP 124/70   Pulse 81   Ht 5\' 2"  (1.575 m)   Wt 172 lb 9.6 oz (78.3 kg)   SpO2 99%   BMI 31.57 kg/m    GEN: Well nourished, well developed, in no acute distress  HEENT: normal  Neck: no JVD, carotid bruits, or masses Cardiac: RRR; no murmurs, rubs, or gallops,no edema  Respiratory:  clear to auscultation bilaterally, normal work of breathing GI: soft, nontender, nondistended, + BS MS: no deformity or atrophy  Skin: warm and dry, no rash Neuro:  Alert and Oriented x 3, Strength and sensation are intact Psych: euthymic mood, full affect  Wt Readings from Last 3 Encounters:  11/02/19 172 lb 9.6 oz (78.3 kg)  04/25/19 180 lb (81.6 kg)  11/11/18 182 lb (82.6 kg)      Studies/Labs Reviewed:   EKG:  EKG is ordered today.  The ekg ordered today demonstrates normal sinus rhythm,  nonspecific T wave/ST changes which is chronic  Recent Labs: 11/02/2018: BUN 17; Creatinine, Ser 1.39; Potassium 3.5; Sodium 143   Lipid Panel    Component Value Date/Time   CHOL 133 05/04/2019 1537   TRIG 151 (H) 05/04/2019 1537   HDL 35 (L) 05/04/2019 1537   CHOLHDL 3.8 05/04/2019 1537   CHOLHDL 3.6 06/23/2015 0336   VLDL 23 06/23/2015 0336   LDLCALC 72 05/04/2019 1537    Additional studies/ records that were reviewed today include:   As summarized above.   ASSESSMENT & PLAN:  1. Nonischemic cardiomyopathy Last echocardiogram showed normalization of her pumping function of the heart with grade 1 diastolic dysfunction. No heart failure symptoms. Continue Lasix, spironolactone and Toprol-XL.  2. Nonobstructive CAD -No angina. Continue aspirin, beta-blocker and statin.  3. Hyperlipidemia -Continue Crestor 40 mg daily -Followed by PCP  4. Surgical clearance -Patient is easily getting at least 4 METS of activity. Reassuring stress test last year. Given past medical history and time since last visit, based on ACC/AHA guidelines, KYRIANA YANKEE would be at acceptable risk for the planned procedure without further cardiovascular testing. She can hold aspirin 5-7 days prior to her surgery.   I will route this recommendation to the requesting party via Epic fax function and remove from pre-op pool.  Please call with questions.  [place], [place] 11/02/2019, 9:17 AM       Medication Adjustments/Labs and Tests Ordered: Current medicines are reviewed at length with the patient today.  Concerns regarding medicines are outlined above.  Medication changes, Labs and Tests ordered today are listed in the Patient Instructions below. Patient Instructions  Medication Instructions:  Your physician recommends that you continue on your current medications as directed. Please refer to the Current Medication list given to you today.  *If you need a refill on your cardiac  medications before your next appointment, please call your pharmacy*   Lab Work: None ordered  If you have labs (blood work) drawn today and your tests are completely normal, you will receive your results only by: Jennifer Kaufman MyChart Message (if you have MyChart) OR . A paper copy in the mail If you have any lab test that is abnormal or we need to change your treatment, we will call you to review the results.   Testing/Procedures: None ordered   Follow-Up: At [facility], you and your health needs are our priority.  As part of our continuing mission to provide you with exceptional heart care, we have created designated Provider Care Teams.  These Care Teams include your primary Cardiologist (physician) and Advanced Practice Providers (APPs -  Physician Assistants and Nurse Practitioners) who all work together to provide you with the care you need, when you need it.  We recommend signing up for the patient portal called "MyChart".  Sign up information is provided on this After Visit Summary.  MyChart is used to connect with patients for Virtual Visits (Telemedicine).  Patients are able to view lab/test results, encounter notes, upcoming appointments, etc.  Non-urgent messages can be sent to your provider as well.   To learn more about what you can do with MyChart, go to [email or web address].    Your next appointment:   6 month(s)  The format for your next appointment:   In Person  Provider:   You may see Tobias Alexander, MD or one of the following Advanced Practice Providers on your designated Care Team:    Ronie Spies, PA-C  Jacolyn Reedy, PA-C    Other Instructions      Signed, Manson Passey, [place]  11/02/2019 9:15 AM    [facility] [address] [place], [place], [place]  [address] Phone: [phone]; Fax: [phone]

## 2019-11-02 NOTE — Patient Instructions (Signed)
Medication Instructions:  Your physician recommends that you continue on your current medications as directed. Please refer to the Current Medication list given to you today.  *If you need a refill on your cardiac medications before your next appointment, please call your pharmacy*   Lab Work: None ordered  If you have labs (blood work) drawn today and your tests are completely normal, you will receive your results only by: . MyChart Message (if you have MyChart) OR . A paper copy in the mail If you have any lab test that is abnormal or we need to change your treatment, we will call you to review the results.   Testing/Procedures: None ordered   Follow-Up: At [facility], you and your health needs are our priority.  As part of our continuing mission to provide you with exceptional heart care, we have created designated Provider Care Teams.  These Care Teams include your primary Cardiologist (physician) and Advanced Practice Providers (APPs -  Physician Assistants and Nurse Practitioners) who all work together to provide you with the care you need, when you need it.  We recommend signing up for the patient portal called "MyChart".  Sign up information is provided on this After Visit Summary.  MyChart is used to connect with patients for Virtual Visits (Telemedicine).  Patients are able to view lab/test results, encounter notes, upcoming appointments, etc.  Non-urgent messages can be sent to your provider as well.   To learn more about what you can do with MyChart, go to [email or web address].    Your next appointment:   6 month(s)  The format for your next appointment:   In Person  Provider:   You may see Katarina Nelson, MD or one of the following Advanced Practice Providers on your designated Care Team:    Dayna Dunn, PA-C  Michele Lenze, PA-C    Other Instructions   

## 2019-11-08 ENCOUNTER — Other Ambulatory Visit: Payer: Self-pay | Admitting: Cardiology

## 2019-11-08 DIAGNOSIS — I5032 Chronic diastolic (congestive) heart failure: Secondary | ICD-10-CM

## 2019-11-08 DIAGNOSIS — I2510 Atherosclerotic heart disease of native coronary artery without angina pectoris: Secondary | ICD-10-CM

## 2019-11-08 DIAGNOSIS — N179 Acute kidney failure, unspecified: Secondary | ICD-10-CM

## 2019-12-05 ENCOUNTER — Telehealth: Payer: Self-pay | Admitting: *Deleted

## 2019-12-05 NOTE — Telephone Encounter (Signed)
   [facility] Pre-operative Risk Assessment    HEARTCARE STAFF: - Please ensure there is not already an duplicate clearance open for this procedure. - Under Visit Info/Reason for Call, type in Other and utilize the format Clearance MM/DD/YY or Clearance TBD. Do not use dashes or single digits. - If request is for dental extraction, please clarify the # of teeth to be extracted.  Request for surgical clearance:  1. What type of surgery is being performed?Right total hip arthroplasty    2. When is this surgery scheduled? TBD   3. What type of clearance is required (medical clearance vs. Pharmacy clearance to hold med vs. Both)? Both  4. Are there any medications that need to be held prior to surgery and how long?Asa 81   5. Practice name and name of physician performing surgery? [facility]; Dr Donivan Scull   6. What is the office phone number? [phone]   7.   What is the office fax number? [phone]  8.   Anesthesia type (None, local, MAC, general) ? General   Jennifer Kaufman L 12/05/2019, 1:49 PM  _________________________________________________________________   (provider comments below)

## 2019-12-05 NOTE — Telephone Encounter (Signed)
Patient recently seen in clinic 10/2019 for surgical clearance and cleared for hip surgery. I reached out to her to ensure no changes in medical history and she reports she already had this surgery. Will route this back to surgeon as FYI and remove from pre-op box.  Addam Goeller PA-C

## 2019-12-07 ENCOUNTER — Ambulatory Visit: Payer: Medicare Other | Admitting: Cardiology

## 2020-05-06 ENCOUNTER — Other Ambulatory Visit: Payer: Self-pay | Admitting: Cardiology

## 2020-05-06 DIAGNOSIS — N183 Chronic kidney disease, stage 3 unspecified: Secondary | ICD-10-CM

## 2020-05-06 DIAGNOSIS — R600 Localized edema: Secondary | ICD-10-CM

## 2020-05-06 DIAGNOSIS — I5043 Acute on chronic combined systolic (congestive) and diastolic (congestive) heart failure: Secondary | ICD-10-CM

## 2020-06-21 NOTE — Progress Notes (Deleted)
Cardiology Office Note:    Date:  06/21/2020   ID:  Jennifer Kaufman, DOB October 25, 1945, MRN [id number]  PCP:  Olive Bass, MD   [facility]  Cardiologist:  Tobias Alexander, MD  Advanced Practice Provider:  No care team member to display Electrophysiologist:  None   Referring MD: Olive Bass, MD    History of Present Illness:    Jennifer Kaufman is a 75 y.o. 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 Delton See now returning to clinic for follow-up.  She has history of echocardiogram in 06/2015 with EF 20-25% with diffuse hypokinesis. [facility] 06/2015 showed 65% mRCA lesion (not physiologically significant by FFR), elevated LVEDP, NICM. She was treated with medical therapy. 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 Last stress test  11/11/18 showing no ischemia, but prior infarct and EF 43%. Last echo 04/2019 showed LVEF of 60-65% and grade 1 DD.   Last saw Chelsea Aus 11/02/19 for surgical clearance prior to right hip replacement. He was doing well at that time and did not require further testing.  Past Medical History:  Diagnosis Date  . Anemia   . Arthritis    "all over"  . CAD (coronary artery disease)    a. 65% RCA lesion in 2017 cath, nonobstructive.  . Chronic combined systolic and diastolic CHF (congestive heart failure) (HCC)   . CKD (chronic kidney disease), stage III   . Gout   . High cholesterol   . History of blood transfusion    'w/hysterectomy"  . Hypertension   . Mild aortic insufficiency   . Mild mitral regurgitation   . Nonischemic cardiomyopathy (HCC)    a. echo 06/22/15 EF of 20-25% with akinesis of the anterior wall b. R & L cath 06/25/15 -nonobstructive RCA (65%) lesion  by FFR, Elevated LVEDP --> medical managment   . Type II diabetes mellitus (HCC)     Past Surgical History:  Procedure Laterality Date  . ABDOMINAL HYSTERECTOMY    . CARDIAC CATHETERIZATION N/A 06/25/2015   Procedure: Right/Left Heart Cath and Coronary Angiography;  Surgeon: Marykay Lex, MD;  Location: [facility] INVASIVE CV LAB;  Service: Cardiovascular;  Laterality: N/A;  . CARPAL TUNNEL RELEASE Bilateral   . JOINT REPLACEMENT    . MENISCUS REPAIR Left    "scoped"  . THYROID LOBECTOMY    . TOTAL HIP ARTHROPLASTY Left 2000  . TUBAL LIGATION      Current Medications: No outpatient medications have been marked as taking for the 06/27/20 encounter (Appointment) with Meriam Sprague, MD.     Allergies:   Atorvastatin, Carvedilol, Hydrochlorothiazide, and Lisinopril   Social History   Socioeconomic History  . Marital status: Widowed    Spouse name: Not on file  . Number of children: Not on file  . Years of education: Not on file  . Highest education level: Not on file  Occupational History  . Not on file  Tobacco Use  . Smoking status: Never Smoker  . Smokeless tobacco: Current User    Types: Snuff  Vaping Use  . Vaping Use: Never used  Substance and Sexual Activity  . Alcohol use: No  . Drug use: No  . Sexual activity: Never  Other Topics Concern  . Not on  file  Social History Narrative  . Not on file   Social Determinants of Health   Financial Resource Strain: Not on file  Food Insecurity: Not on file  Transportation Needs: Not on file  Physical Activity: Not on file  Stress: Not on file  Social Connections: Not on file     Family History: The patient's ***family history includes Hypertension in her mother.  ROS:   Please see the history of present illness.    *** All other systems reviewed and are negative.  EKGs/Labs/Other Studies Reviewed:    The following studies were reviewed today: TTE 05/10/2019: 1. Left ventricular ejection fraction, by  estimation, is 60 to 65%. The  left ventricle has normal function. The left ventricle has no regional  wall motion abnormalities. There is moderate concentric left ventricular  hypertrophy. Left ventricular  diastolic parameters are consistent with Grade I diastolic dysfunction  (impaired relaxation). The average left ventricular global longitudinal  strain is -16.0 %.  2. Right ventricular systolic function is normal. The right ventricular  size is normal. There is normal pulmonary artery systolic pressure. The  estimated right ventricular systolic pressure is 21.8 mmHg.  3. Left atrial size was mildly dilated.  4. The mitral valve is normal in structure and function. Mild mitral  valve regurgitation. No evidence of mitral stenosis.  5. The aortic valve is normal in structure and function. Aortic valve  regurgitation is mild. No aortic stenosis is present.  6. The inferior vena cava is normal in size with greater than 50%  respiratory variability, suggesting right atrial pressure of 3 mmHg.   TTE 06/22/15: Study Conclusions   - Left ventricle: The cavity size was normal. Wall thickness was  increased in a pattern of mild LVH. Systolic function was  severely reduced. The estimated ejection fraction was in the  range of 20% to 25%. Diffuse hypokinesis. There is akinesis of  the anteroseptal and apical myocardium. Doppler parameters are  consistent with restrictive physiology, indicative of decreased  left ventricular diastolic compliance and/or increased left  atrial pressure.  - Aortic valve: There was mild regurgitation.  - Mitral valve: There was mild regurgitation.  - Pericardium, extracardiac: A small pericardial effusion was  identified. There was a left pleural effusion.   Cath 06/25/15: 1. Mid RCA lesion, 65% stenosed. FFR non-physiologically significant 2. No severe cardiomyopathy, likely nonischemic 3. Elevated LVEDP is consistent with Pulmonary  Capillary Wedge Pressure monitoring.   Nonobstructive RCA lesion by FFR Nonischemic Cardiomyopathy.  Plan:  Return to nursing unit after sheath removal.  Continue IV diuresis and optimization of medical management.  Myoview 11/11/2018:  EKG non-conclusive due to resting ST depressions  Nuclear stress EF: 43%.  The left ventricular ejection fraction is moderately decreased (30-44%).  Defect 1: There is a small fixed defect present in the apical inferior and apex location. This is consistent with an infarct  Findings consistent with prior myocardial infarction.  This is a low risk study.     EKG:  EKG is *** ordered today.  The ekg ordered today demonstrates ***  Recent Labs: No results found for requested labs within last 8760 hours.  Recent Lipid Panel    Component Value Date/Time   CHOL 133 05/10/19 1537   TRIG 151 (H) 05-10-2019 1537   HDL 35 (L) May 10, 2019 1537   CHOLHDL 3.8 05/10/19 1537   CHOLHDL 3.6 06/23/2015 0336   VLDL 23 06/23/2015 0336   LDLCALC 72 05/10/19 1537     Risk  Assessment/Calculations:   {Does this patient have ATRIAL FIBRILLATION?:[id number]}   Physical Exam:    VS:  There were no vitals taken for this visit.    Wt Readings from Last 3 Encounters:  11/02/19 172 lb 9.6 oz (78.3 kg)  04/25/19 180 lb (81.6 kg)  11/11/18 182 lb (82.6 kg)     GEN: *** Well nourished, well developed in no acute distress HEENT: Normal NECK: No JVD; No carotid bruits LYMPHATICS: No lymphadenopathy CARDIAC: ***RRR, no murmurs, rubs, gallops RESPIRATORY:  Clear to auscultation without rales, wheezing or rhonchi  ABDOMEN: Soft, non-tender, non-distended MUSCULOSKELETAL:  No edema; No deformity  SKIN: Warm and dry NEUROLOGIC:  Alert and oriented x 3 PSYCHIATRIC:  Normal affect   ASSESSMENT:    No diagnosis found. PLAN:    In order of problems listed above:  #Chronic combined CHF/history of NICM with recovered EF: TTE 04/2019 with LVEF 60-65%  from 20-25% in 06/2015. Appears compensated with NYHA class I-II symptoms on exam. -Continue lasix 40mg  daily -Continue metop 12.5mg  XL daily -Continue spiro 25mg  daily -?Add ARB  #Non-obstructive CAD: 65% RCA lesion with negative FFR on cath in 2017. No anginal symptoms. 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 stress test, cath, DCCV, TEE, etc)?   Press F2        :    Medication Adjustments/Labs and Tests Ordered: Current medicines are reviewed at length with the patient today.  Concerns regarding medicines are outlined above.  No orders of the defined types were placed in this encounter.  No orders of the defined types were placed in this encounter.   There are no Patient Instructions on file for this visit.   Signed, 2018, MD  06/21/2020 6:20 PM    [facility]

## 2020-06-27 ENCOUNTER — Ambulatory Visit: Payer: Medicare Other | Admitting: Cardiology

## 2020-07-23 ENCOUNTER — Ambulatory Visit: Payer: Medicare Other | Admitting: Cardiology

## 2020-08-29 NOTE — Progress Notes (Deleted)
Cardiology Office Note:    Date:  08/29/2020   ID:  SURY WENTWORTH, DOB Jan 19, 1946, MRN [id number]  PCP:  Olive Bass, MD   [facility] Providers Cardiologist:  Tobias Alexander, MD (Inactive) {  Referring MD: Olive Bass, MD    History of Present Illness:    Jennifer Kaufman is a 75 y.o. 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 Delton See who now presents to clinic for follow-up.  Per review of the record, patient has history of  echocardiogram in 06/2015 with EF 20-25% with diffuse hypokinesis. [facility] 06/2015 showed 65% mRCA lesion (not physiologically significant by FFR), elevated LVEDP, NICM. She was treated with medical therapy. 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 Last stress test  11/11/18 showed no ischemia, but prior infarct and EF 43%.Last echo 04/2019 showed LVEF of 60-65% and grade 1 DD.     Past Medical History:  Diagnosis Date   Anemia    Arthritis    "all over"   CAD (coronary artery disease)    a. 65% RCA lesion in 2017 cath, nonobstructive.   Chronic combined systolic and diastolic CHF (congestive heart failure) (HCC)    CKD (chronic kidney disease), stage III    Gout    High cholesterol    History of blood transfusion    'w/hysterectomy"   Hypertension    Mild aortic insufficiency    Mild mitral regurgitation    Nonischemic cardiomyopathy (HCC)    a. echo 06/22/15 EF of 20-25% with akinesis of the anterior wall b. R & L cath 06/25/15 -nonobstructive RCA (65%) lesion by FFR, Elevated LVEDP --> medical managment    Type II diabetes mellitus (HCC)     Past Surgical History:  Procedure Laterality Date   ABDOMINAL HYSTERECTOMY     CARDIAC CATHETERIZATION N/A 06/25/2015   Procedure: Right/Left Heart Cath and Coronary Angiography;  Surgeon: Marykay Lex, MD;  Location: [facility] INVASIVE CV LAB;  Service: Cardiovascular;  Laterality: N/A;   CARPAL TUNNEL RELEASE Bilateral    JOINT REPLACEMENT     MENISCUS REPAIR Left    "scoped"   THYROID LOBECTOMY     TOTAL HIP ARTHROPLASTY Left 2000   TUBAL LIGATION      Current Medications: No outpatient medications have been marked as taking for the 09/10/20 encounter (Appointment) with Meriam Sprague, MD.     Allergies:   Atorvastatin, Carvedilol, Hydrochlorothiazide, and Lisinopril   Social History   Socioeconomic History   Marital status: Widowed    Spouse name: Not on file   Number of children: Not on file   Years of education: Not on file   Highest education level: Not on file  Occupational History   Not on file  Tobacco Use   Smoking status: Never   Smokeless tobacco: Current    Types: Snuff  Vaping Use   Vaping Use: Never used  Substance and Sexual Activity   Alcohol use: No   Drug use: No   Sexual activity: Never  Other Topics Concern   Not on file  Social History Narrative   Not on file   Social Determinants of Health   Financial Resource Strain: Not on file  Food Insecurity: Not on file  Transportation Needs: Not on file  Physical Activity: Not on file  Stress: Not on file  Social Connections: Not on file     Family History: The patient's ***family history includes Hypertension in her mother.  ROS:   Please see the history of present illness.    *** All other systems reviewed and are negative.  EKGs/Labs/Other Studies Reviewed:    The following studies were reviewed today: TTE 05/20/2019: IMPRESSIONS     1. Left ventricular ejection fraction, by estimation, is 60 to 65%. The  left ventricle has normal function. The left ventricle has no regional  wall motion abnormalities. There is moderate concentric left ventricular  hypertrophy. Left ventricular  diastolic parameters are consistent with Grade I diastolic dysfunction  (impaired relaxation). The  average left ventricular global longitudinal  strain is -16.0 %.   2. Right ventricular systolic function is normal. The right ventricular  size is normal. There is normal pulmonary artery systolic pressure. The  estimated right ventricular systolic pressure is 21.8 mmHg.   3. Left atrial size was mildly dilated.   4. The mitral valve is normal in structure and function. Mild mitral  valve regurgitation. No evidence of mitral stenosis.   5. The aortic valve is normal in structure and function. Aortic valve  regurgitation is mild. No aortic stenosis is present.   6. The inferior vena cava is normal in size with greater than 50%  respiratory variability, suggesting right atrial pressure of 3 mmHg.   Myoview 11/11/18: EKG non-conclusive due to resting ST depressions Nuclear stress EF: 43%. The left ventricular ejection fraction is moderately decreased (30-44%). Defect 1: There is a small fixed defect present in the apical inferior and apex location. This is consistent with an infarct Findings consistent with prior myocardial infarction. This is a low risk study.   EKG:  EKG is *** ordered today.  The ekg ordered today demonstrates ***  Recent Labs: No results found for requested labs within last 8760 hours.  Recent Lipid Panel    Component Value Date/Time   CHOL 133 05/04/2019 1537   TRIG 151 (H) 05/04/2019 1537   HDL 35 (L) 05/04/2019 1537   CHOLHDL 3.8 05/04/2019 1537   CHOLHDL 3.6 06/23/2015 0336   VLDL 23 06/23/2015 0336   LDLCALC 72 05/04/2019 1537     Risk Assessment/Calculations:   {Does this patient have ATRIAL FIBRILLATION?:[id number]}   Physical Exam:    VS:  There were no vitals taken for this visit.    Wt Readings from Last 3 Encounters:  11/02/19 172 lb 9.6 oz (78.3 kg)  04/25/19 180 lb (81.6 kg)  11/11/18 182 lb (82.6 kg)     GEN: *** Well nourished, well developed in no acute distress HEENT: Normal NECK: No JVD; No carotid bruits LYMPHATICS: No  lymphadenopathy CARDIAC: ***RRR, no murmurs, rubs, gallops RESPIRATORY:  Clear to auscultation without rales, wheezing or rhonchi  ABDOMEN: Soft, non-tender, non-distended MUSCULOSKELETAL:  No edema; No deformity  SKIN: Warm and dry NEUROLOGIC:  Alert and oriented x 3 PSYCHIATRIC:  Normal affect   ASSESSMENT:    No diagnosis found. PLAN:    In order of problems listed above:  #Nonischemic CM: #Chronic combined systolic and diastolic HF with recovered EF: Initial EF 20-25% in 2017 now recovered LVEF 05/20/19 60-65%. Clinically euvolemic with NYHA class II symptoms.  -Continue lasix 40mg  daily -Continue spiro 25mg  daily -Continue metop 25mg  XL daily  #Non-obstructive CAD: -Continue ASA 81mg  daily -Continue crestor 40mg  daily  #HLD: -Continue crestor 40mg  daily  {Are you ordering a CV Procedure (e.g. stress  test, cath, DCCV, TEE, etc)?   Press F2        :[id number]}    Medication Adjustments/Labs and Tests Ordered: Current medicines are reviewed at length with the patient today.  Concerns regarding medicines are outlined above.  No orders of the defined types were placed in this encounter.  No orders of the defined types were placed in this encounter.   There are no Patient Instructions on file for this visit.   Signed, Meriam Sprague, MD  08/29/2020 2:56 PM    [facility]

## 2020-09-10 ENCOUNTER — Other Ambulatory Visit: Payer: Self-pay

## 2020-09-10 ENCOUNTER — Ambulatory Visit (INDEPENDENT_AMBULATORY_CARE_PROVIDER_SITE_OTHER): Payer: Medicare Other | Admitting: Cardiology

## 2020-09-10 ENCOUNTER — Encounter: Payer: Self-pay | Admitting: Cardiology

## 2020-09-10 VITALS — BP 120/64 | HR 58 | Ht 62.0 in | Wt 175.2 lb

## 2020-09-10 DIAGNOSIS — I2510 Atherosclerotic heart disease of native coronary artery without angina pectoris: Secondary | ICD-10-CM

## 2020-09-10 DIAGNOSIS — N1832 Chronic kidney disease, stage 3b: Secondary | ICD-10-CM

## 2020-09-10 DIAGNOSIS — I5042 Chronic combined systolic (congestive) and diastolic (congestive) heart failure: Secondary | ICD-10-CM

## 2020-09-10 DIAGNOSIS — I10 Essential (primary) hypertension: Secondary | ICD-10-CM

## 2020-09-10 DIAGNOSIS — I428 Other cardiomyopathies: Secondary | ICD-10-CM | POA: Diagnosis not present

## 2020-09-10 DIAGNOSIS — Z79899 Other long term (current) drug therapy: Secondary | ICD-10-CM

## 2020-09-10 DIAGNOSIS — E782 Mixed hyperlipidemia: Secondary | ICD-10-CM

## 2020-09-10 DIAGNOSIS — I340 Nonrheumatic mitral (valve) insufficiency: Secondary | ICD-10-CM

## 2020-09-10 DIAGNOSIS — I351 Nonrheumatic aortic (valve) insufficiency: Secondary | ICD-10-CM

## 2020-09-10 LAB — LIPID PANEL
Chol/HDL Ratio: 2.6 ratio (ref 0.0–4.4)
Cholesterol, Total: 113 mg/dL (ref 100–199)
HDL: 44 mg/dL (ref >39)
LDL Chol Calc (NIH): 55 mg/dL (ref 0–99)
Triglycerides: 64 mg/dL (ref 0–149)
VLDL Cholesterol Cal: 14 mg/dL (ref 5–40)

## 2020-09-10 MED ORDER — NITROGLYCERIN 0.4 MG SL SUBL: SUBLINGUAL_TABLET | SUBLINGUAL | 3 refills | Status: DC

## 2020-09-10 MED ORDER — SPIRONOLACTONE 25 MG PO TABS: ORAL_TABLET | ORAL | 2 refills | Status: DC

## 2020-09-10 MED ORDER — METOPROLOL SUCCINATE ER 25 MG PO TB24: ORAL_TABLET | ORAL | 3 refills | Status: DC

## 2020-09-10 NOTE — Patient Instructions (Signed)
Medication Instructions:   Your physician recommends that you continue on your current medications as directed. Please refer to the Current Medication list given to you today.  *If you need a refill on your cardiac medications before your next appointment, please call your pharmacy*   Lab Work:  TODAY--LIPIDS  If you have labs (blood work) drawn today and your tests are completely normal, you will receive your results only by: MyChart Message (if you have MyChart) OR A paper copy in the mail If you have any lab test that is abnormal or we need to change your treatment, we will call you to review the results.   Follow-Up: At [facility], you and your health needs are our priority.  As part of our continuing mission to provide you with exceptional heart care, we have created designated Provider Care Teams.  These Care Teams include your primary Cardiologist (physician) and Advanced Practice Providers (APPs -  Physician Assistants and Nurse Practitioners) who all work together to provide you with the care you need, when you need it.  We recommend signing up for the patient portal called "MyChart".  Sign up information is provided on this After Visit Summary.  MyChart is used to connect with patients for Virtual Visits (Telemedicine).  Patients are able to view lab/test results, encounter notes, upcoming appointments, etc.  Non-urgent messages can be sent to your provider as well.   To learn more about what you can do with MyChart, go to [email or web address].    Your next appointment:   6 month(s)  The format for your next appointment:   In Person  Provider:   Laurance Flatten, MD

## 2020-09-10 NOTE — Progress Notes (Addendum)
Cardiology Office Note:    Date:  09/10/2020   ID:  Jennifer Kaufman, DOB [birth year], MRN [id number]  PCP:  Olive Bass, MD   [facility] Providers Cardiologist:  Tobias Alexander, MD (Inactive) {  Referring MD: Olive Bass, MD    History of Present Illness:    Jennifer Kaufman is a 75 y.o. 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 Delton See who now presents to clinic for follow-up.  Per review of the record, patient has history of  echocardiogram in 06/2015 with EF 20-25% with diffuse hypokinesis. [facility] 06/2015 showed 65% mRCA lesion (not physiologically significant by FFR), elevated LVEDP, NICM. She was treated with medical therapy. 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 Last stress test  11/11/18 showing no ischemia, but prior infarct and EF 43%.Last echo 04/2019 showed LVEF of 60-65% and grade 1 DD.   Today, she is accompanied by her daughter, who also provides some history. She is feeling good overall. She continues to have mild DOE and has to take breaks after prolonged walking which is stable for her. Otherwise, LE is mainly controlled on lasix as needed.   She denies any chest pain, palpitations, or exertional symptoms. No headaches or syncope to report. Also has no orthopnea or PND.   Past Medical History:  Diagnosis Date   Anemia    Arthritis    "all over"   CAD (coronary artery disease)    a. 65% RCA lesion in 2017 cath, nonobstructive.   Chronic combined systolic and diastolic CHF (congestive heart failure) (HCC)    CKD (chronic kidney disease), stage III (HCC)    Gout    High cholesterol    History of blood transfusion    'w/hysterectomy"   Hypertension    Mild aortic insufficiency    Mild mitral regurgitation    Nonischemic cardiomyopathy (HCC)     a. echo 06/22/15 EF of 20-25% with akinesis of the anterior wall b. R & L cath 06/25/15 -nonobstructive RCA (65%) lesion by FFR, Elevated LVEDP --> medical managment    Type II diabetes mellitus (HCC)     Past Surgical History:  Procedure Laterality Date   ABDOMINAL HYSTERECTOMY     CARDIAC CATHETERIZATION N/A 06/25/2015   Procedure: Right/Left Heart Cath and Coronary Angiography;  Surgeon: Marykay Lex, MD;  Location: [facility] INVASIVE CV LAB;  Service: Cardiovascular;  Laterality: N/A;   CARPAL TUNNEL RELEASE Bilateral    JOINT REPLACEMENT     MENISCUS REPAIR Left    "scoped"   THYROID LOBECTOMY     TOTAL HIP ARTHROPLASTY Left 2000   TUBAL LIGATION      Current Medications: Current Meds  Medication Sig   aspirin EC 81 MG EC tablet Take 1 tablet (81 mg total) by mouth daily.   furosemide (LASIX) 40 MG tablet Take 40 mg by mouth as needed.   rosuvastatin (CRESTOR) 40 MG tablet Take 40 mg by mouth daily.   ULORIC 40 MG tablet Take 40 mg by mouth daily.    [DISCONTINUED] metoprolol succinate (TOPROL-XL) 25 MG 24 hr tablet TAKE 1/2 TABLET(12.5 MG) BY MOUTH DAILY   [DISCONTINUED] nitroGLYCERIN (NITROSTAT) 0.4 MG SL tablet DISSOLVE 1 TABLET UNDER THE TONGUE EVERY 5 MINUTES AS NEEDED FOR CHEST PAIN   [DISCONTINUED] spironolactone (ALDACTONE) 25 MG tablet TAKE  1 TABLET(25 MG) BY MOUTH TWICE DAILY     Allergies:   Atorvastatin, Carvedilol, Hydrochlorothiazide, and Lisinopril   Social History   Socioeconomic History   Marital status: Widowed    Spouse name: Not on file   Number of children: Not on file   Years of education: Not on file   Highest education level: Not on file  Occupational History   Not on file  Tobacco Use   Smoking status: Never   Smokeless tobacco: Current    Types: Snuff  Vaping Use   Vaping Use: Never used  Substance and Sexual Activity   Alcohol use: No   Drug use: No   Sexual activity: Never  Other Topics Concern   Not on file  Social History Narrative    Not on file   Social Determinants of Health   Financial Resource Strain: Not on file  Food Insecurity: Not on file  Transportation Needs: Not on file  Physical Activity: Not on file  Stress: Not on file  Social Connections: Not on file     Family History: The patient's family history includes Hypertension in her mother.  ROS:   Review of Systems  Constitutional:  Negative for chills, diaphoresis and fever.  HENT:  Negative for ear pain and nosebleeds.   Eyes:  Negative for blurred vision and double vision.  Respiratory:  Positive for shortness of breath. Negative for cough.   Cardiovascular:  Positive for leg swelling. Negative for chest pain, palpitations, orthopnea, claudication and PND.  Gastrointestinal:  Negative for heartburn, melena and nausea.  Genitourinary:  Negative for dysuria and urgency.  Musculoskeletal:  Negative for myalgias and neck pain.  Neurological:  Positive for dizziness. Negative for weakness and headaches.  Endo/Heme/Allergies:  Negative for environmental allergies.  Psychiatric/Behavioral:  Negative for depression and suicidal ideas.     EKGs/Labs/Other Studies Reviewed:    The following studies were reviewed today: TTE May 21, 2019: IMPRESSIONS     1. Left ventricular ejection fraction, by estimation, is 60 to 65%. The  left ventricle has normal function. The left ventricle has no regional  wall motion abnormalities. There is moderate concentric left ventricular  hypertrophy. Left ventricular  diastolic parameters are consistent with Grade I diastolic dysfunction  (impaired relaxation). The average left ventricular global longitudinal  strain is -16.0 %.   2. Right ventricular systolic function is normal. The right ventricular  size is normal. There is normal pulmonary artery systolic pressure. The  estimated right ventricular systolic pressure is 21.8 mmHg.   3. Left atrial size was mildly dilated.   4. The mitral valve is normal in structure  and function. Mild mitral  valve regurgitation. No evidence of mitral stenosis.   5. The aortic valve is normal in structure and function. Aortic valve  regurgitation is mild. No aortic stenosis is present.   6. The inferior vena cava is normal in size with greater than 50%  respiratory variability, suggesting right atrial pressure of 3 mmHg.   Myoview 11/11/18: EKG non-conclusive due to resting ST depressions Nuclear stress EF: 43%. The left ventricular ejection fraction is moderately decreased (30-44%). Defect 1: There is a small fixed defect present in the apical inferior and apex location. This is consistent with an infarct Findings consistent with prior myocardial infarction. This is a low risk study.   EKG:   09/10/2020: Sinus bradycardia, rate 58, poor R wave progression  Recent Labs: No results found for requested labs within last 8760 hours.  Recent Lipid Panel  Component Value Date/Time   CHOL 133 05/04/2019 1537   TRIG 151 (H) 05/04/2019 1537   HDL 35 (L) 05/04/2019 1537   CHOLHDL 3.8 05/04/2019 1537   CHOLHDL 3.6 06/23/2015 0336   VLDL 23 06/23/2015 0336   LDLCALC 72 05/04/2019 1537     Risk Assessment/Calculations:       Physical Exam:    VS:  BP 120/64   Pulse (!) 58   Ht 5\' 2"  (1.575 m)   Wt 175 lb 3.2 oz (79.5 kg)   SpO2 99%   BMI 32.04 kg/m     Wt Readings from Last 3 Encounters:  09/10/20 175 lb 3.2 oz (79.5 kg)  11/02/19 172 lb 9.6 oz (78.3 kg)  04/25/19 180 lb (81.6 kg)     GEN: Well nourished, well developed in no acute distress HEENT: Normal NECK: No JVD; No carotid bruits CARDIAC: Bradycardic, regular, no murmurs, rubs, gallops RESPIRATORY:  Clear to auscultation without rales, wheezing or rhonchi  ABDOMEN: Soft, non-tender, non-distended MUSCULOSKELETAL:  No edema; No deformity  SKIN: Warm and dry NEUROLOGIC:  Alert and oriented x 3 PSYCHIATRIC:  Normal affect   ASSESSMENT:    1. Chronic combined systolic and diastolic CHF  (congestive heart failure) (HCC)   2. Coronary artery disease involving native coronary artery of native heart without angina pectoris   3. Nonischemic cardiomyopathy (HCC)   4. Stage 3b chronic kidney disease (HCC)   5. Medication management   6. Essential hypertension   7. Mild aortic insufficiency   8. Mild mitral regurgitation   9. Mixed hyperlipidemia    PLAN:    In order of problems listed above:  #Nonischemic CM: #Chronic combined systolic and diastolic HF with recovered EF: Initial EF 20-25% in 2017 now recovered LVEF 04/2019 60-65%. Clinically euvolemic with NYHA class II symptoms. Tolerating all medications as prescribed. -Continue lasix 40mg  daily -Continue spironolactone 25mg  daily -Continue metop 25mg  XL daily  #Non-obstructive CAD: No anginal symptoms.  -Continue ASA 81mg  daily -Continue crestor 40mg  daily  #HLD: LDL goal <100. Last 72 in 04/2019. -Repeat cholesterol today -Continue crestor 40mg  daily  #Mild AR #Mild MR: -Serial TTE monitoring with next 2024     Follow-up in 6 months.  Medication Adjustments/Labs and Tests Ordered: Current medicines are reviewed at length with the patient today.  Concerns regarding medicines are outlined above.  Orders Placed This Encounter  Procedures   Lipid Profile   EKG 12-Lead   Meds ordered this encounter  Medications   spironolactone (ALDACTONE) 25 MG tablet    Sig: TAKE 1 TABLET(25 MG) BY MOUTH TWICE DAILY    Dispense:  90 tablet    Refill:  2   metoprolol succinate (TOPROL-XL) 25 MG 24 hr tablet    Sig: TAKE 1/2 TABLET(12.5 MG) BY MOUTH DAILY    Dispense:  45 tablet    Refill:  3   nitroGLYCERIN (NITROSTAT) 0.4 MG SL tablet    Sig: DISSOLVE 1 TABLET UNDER THE TONGUE EVERY 5 MINUTES AS NEEDED FOR CHEST PAIN    Dispense:  25 tablet    Refill:  3    **Patient requests 90 days supply**    Patient Instructions  Medication Instructions:   Your physician recommends that you continue on your current  medications as directed. Please refer to the Current Medication list given to you today.  *If you need a refill on your cardiac medications before your next appointment, please call your pharmacy*   Lab Work:  TODAY--LIPIDS  If  you have labs (blood work) drawn today and your tests are completely normal, you will receive your results only by: MyChart Message (if you have MyChart) OR A paper copy in the mail If you have any lab test that is abnormal or we need to change your treatment, we will call you to review the results.   Follow-Up: At [facility], you and your health needs are our priority.  As part of our continuing mission to provide you with exceptional heart care, we have created designated Provider Care Teams.  These Care Teams include your primary Cardiologist (physician) and Advanced Practice Providers (APPs -  Physician Assistants and Nurse Practitioners) who all work together to provide you with the care you need, when you need it.  We recommend signing up for the patient portal called "MyChart".  Sign up information is provided on this After Visit Summary.  MyChart is used to connect with patients for Virtual Visits (Telemedicine).  Patients are able to view lab/test results, encounter notes, upcoming appointments, etc.  Non-urgent messages can be sent to your provider as well.   To learn more about what you can do with MyChart, go to [email or web address].    Your next appointment:   6 month(s)  The format for your next appointment:   In Person  Provider:   Laurance Flatten, MD      [facility] Stumpf,acting as a scribe for Meriam Sprague, MD.,have documented all relevant documentation on the behalf of Meriam Sprague, MD,as directed by  Meriam Sprague, MD while in the presence of Meriam Sprague, MD.  I, Meriam Sprague, MD, have reviewed all documentation for this visit. The documentation on 09/10/20 for the exam, diagnosis, procedures, and  orders are all accurate and complete.   Signed, Meriam Sprague, MD  09/10/2020 12:24 PM     [facility]

## 2020-09-11 ENCOUNTER — Telehealth: Payer: Self-pay | Admitting: Cardiology

## 2020-09-11 NOTE — Telephone Encounter (Signed)
-----   Message from Meriam Sprague, MD sent at 09/10/2020  6:43 PM EDT ----- Her cholesterol levels are excellent. No changes.

## 2020-09-11 NOTE — Telephone Encounter (Signed)
Jennifer Kaufman is returning a call about mother's results

## 2020-09-11 NOTE — Telephone Encounter (Signed)
The patients daughter Gabriel Rung (on [place]) has been notified of the result and verbalized understanding.  All questions (if any) were answered. Loa Socks, LPN 0/30/0923 30:07 PM

## 2020-11-07 ENCOUNTER — Other Ambulatory Visit: Payer: Self-pay | Admitting: Physician Assistant

## 2020-11-07 DIAGNOSIS — Z79899 Other long term (current) drug therapy: Secondary | ICD-10-CM

## 2020-11-07 DIAGNOSIS — I428 Other cardiomyopathies: Secondary | ICD-10-CM

## 2020-11-07 DIAGNOSIS — N1832 Chronic kidney disease, stage 3b: Secondary | ICD-10-CM

## 2020-11-07 DIAGNOSIS — I2510 Atherosclerotic heart disease of native coronary artery without angina pectoris: Secondary | ICD-10-CM

## 2021-04-08 ENCOUNTER — Other Ambulatory Visit: Payer: Self-pay | Admitting: *Deleted

## 2021-04-08 DIAGNOSIS — I428 Other cardiomyopathies: Secondary | ICD-10-CM

## 2021-04-08 DIAGNOSIS — Z79899 Other long term (current) drug therapy: Secondary | ICD-10-CM

## 2021-04-08 DIAGNOSIS — I2510 Atherosclerotic heart disease of native coronary artery without angina pectoris: Secondary | ICD-10-CM

## 2021-04-08 DIAGNOSIS — N1832 Chronic kidney disease, stage 3b: Secondary | ICD-10-CM

## 2021-04-08 MED ORDER — SPIRONOLACTONE 25 MG PO TABS: ORAL_TABLET | ORAL | 3 refills | Status: DC

## 2021-05-15 ENCOUNTER — Other Ambulatory Visit: Payer: Self-pay

## 2021-05-15 MED ORDER — FUROSEMIDE 40 MG PO TABS: 40.0000 mg | ORAL_TABLET | ORAL | 3 refills | Status: DC | PRN

## 2021-07-13 NOTE — Progress Notes (Deleted)
?Cardiology Office Note:   ? ?Date:  07/13/2021  ? ?ID:  Jennifer Kaufman, DOB May 07, 1945, MRN [id number] ? ?PCP:  Algis Greenhouse, MD ?  ?[facility] Providers ?Cardiologist:  Ena Dawley, MD { ? ?Referring MD: Algis Greenhouse, MD  ? ? ?History of Present Illness:   ? ?Jennifer Kaufman is a 76 y.o. 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 Meda Coffee who now presents to clinic for follow-up. ? ?Per review of the record, patient has history of  echocardiogram in 06/2015 with EF 20-25% with diffuse hypokinesis. [facility] 06/2015 showed 65% mRCA lesion (not physiologically significant by FFR), elevated LVEDP, NICM. She was treated with medical therapy. 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 Last stress test  11/11/18 showing no ischemia, but prior infarct and EF 43%.Last echo 04/2019 showed LVEF of 60-65% and grade 1 DD.  ? ?Was last seen in clinic in 08/2020 where she was doing well from a CV standpoint.  ? ?Today, *** ? ? ?Past Medical History:  ?Diagnosis Date  ? Anemia   ? Arthritis   ? "all over"  ? CAD (coronary artery disease)   ? a. 65% RCA lesion in 2017 cath, nonobstructive.  ? Chronic combined systolic and diastolic CHF (congestive heart failure) ([place])   ? CKD (chronic kidney disease), stage III ([place])   ? Gout   ? High cholesterol   ? History of blood transfusion   ? 'w/hysterectomy"  ? Hypertension   ? Mild aortic insufficiency   ? Mild mitral regurgitation   ? Nonischemic cardiomyopathy ([place])   ? a. echo 06/22/15 EF of 20-25% with akinesis of the anterior wall b. R & L cath 06/25/15 -nonobstructive RCA (65%) lesion by FFR, Elevated LVEDP --> medical managment   ? Type II diabetes mellitus ([place])   ? ? ?Past Surgical History:  ?Procedure Laterality Date  ? ABDOMINAL HYSTERECTOMY    ? CARDIAC CATHETERIZATION  N/A 06/25/2015  ? Procedure: Right/Left Heart Cath and Coronary Angiography;  Surgeon: Leonie Man, MD;  Location: [place] CV LAB;  Service: Cardiovascular;  Laterality: N/A;  ? CARPAL TUNNEL RELEASE Bilateral   ? JOINT REPLACEMENT    ? MENISCUS REPAIR Left   ? "scoped"  ? THYROID LOBECTOMY    ? TOTAL HIP ARTHROPLASTY Left 2000  ? TUBAL LIGATION    ? ? ?Current Medications: ?No outpatient medications have been marked as taking for the 07/17/21 encounter (Appointment) with Freada Bergeron, MD.  ?  ? ?Allergies:   Atorvastatin, Carvedilol, Hydrochlorothiazide, and Lisinopril  ? ?Social History  ? ?Socioeconomic History  ? Marital status: Widowed  ?  Spouse name: Not on file  ? Number of children: Not on file  ? Years of education: Not on file  ? Highest education level: Not on file  ?Occupational History  ? Not on file  ?Tobacco Use  ? Smoking status: Never  ? Smokeless tobacco: Current  ?  Types: Snuff  ?Vaping Use  ? Vaping Use: Never used  ?Substance and Sexual Activity  ? Alcohol use: No  ? Drug use: No  ? Sexual activity: Never  ?Other Topics Concern  ? Not on file  ?Social History Narrative  ? Not on file  ? ?Social Determinants of Health  ? ?Financial Resource Strain: Not on  file  ?Food Insecurity: Not on file  ?Transportation Needs: Not on file  ?Physical Activity: Not on file  ?Stress: Not on file  ?Social Connections: Not on file  ?  ? ?Family History: ?The patient's family history includes Hypertension in her mother. ? ?ROS:   ?Review of Systems  ?Constitutional:  Negative for chills, diaphoresis and fever.  ?HENT:  Negative for ear pain and nosebleeds.   ?Eyes:  Negative for blurred vision and double vision.  ?Respiratory:  Positive for shortness of breath. Negative for cough.   ?Cardiovascular:  Positive for leg swelling. Negative for chest pain, palpitations, orthopnea, claudication and PND.  ?Gastrointestinal:  Negative for heartburn, melena and nausea.  ?Genitourinary:  Negative for dysuria  and urgency.  ?Musculoskeletal:  Negative for myalgias and neck pain.  ?Neurological:  Positive for dizziness. Negative for weakness and headaches.  ?Endo/Heme/Allergies:  Negative for environmental allergies.  ?Psychiatric/Behavioral:  Negative for depression and suicidal ideas.   ? ? ?EKGs/Labs/Other Studies Reviewed:   ? ?The following studies were reviewed today: ?TTE 04/2019: ?IMPRESSIONS  ? ? ? 1. Left ventricular ejection fraction, by estimation, is 60 to 65%. The  ?left ventricle has normal function. The left ventricle has no regional  ?wall motion abnormalities. There is moderate concentric left ventricular  ?hypertrophy. Left ventricular  ?diastolic parameters are consistent with Grade I diastolic dysfunction  ?(impaired relaxation). The average left ventricular global longitudinal  ?strain is -16.0 %.  ? 2. Right ventricular systolic function is normal. The right ventricular  ?size is normal. There is normal pulmonary artery systolic pressure. The  ?estimated right ventricular systolic pressure is 123XX123 mmHg.  ? 3. Left atrial size was mildly dilated.  ? 4. The mitral valve is normal in structure and function. Mild mitral  ?valve regurgitation. No evidence of mitral stenosis.  ? 5. The aortic valve is normal in structure and function. Aortic valve  ?regurgitation is mild. No aortic stenosis is present.  ? 6. The inferior vena cava is normal in size with greater than 50%  ?respiratory variability, suggesting right atrial pressure of 3 mmHg.  ? ?Myoview 11/11/18: ?EKG non-conclusive due to resting ST depressions ?Nuclear stress EF: 43%. ?The left ventricular ejection fraction is moderately decreased (30-44%). ?Defect 1: There is a small fixed defect present in the apical inferior and apex location. This is consistent with an infarct ?Findings consistent with prior myocardial infarction. ?This is a low risk study. ? ? ?EKG:   ?09/10/2020: Sinus bradycardia, rate 58, poor R wave progression ? ?Recent Labs: ?No  results found for requested labs within last 8760 hours.  ?Recent Lipid Panel ?   ?Component Value Date/Time  ? CHOL 113 09/10/2020 1038  ? TRIG 64 09/10/2020 1038  ? HDL 44 09/10/2020 1038  ? CHOLHDL 2.6 09/10/2020 1038  ? CHOLHDL 3.6 06/23/2015 0336  ? VLDL 23 06/23/2015 0336  ? [place] 55 09/10/2020 1038  ? ? ? ?Risk Assessment/Calculations:   ?  ? ? ?Physical Exam:   ? ?VS:  There were no vitals taken for this visit.   ? ?Wt Readings from Last 3 Encounters:  ?09/10/20 175 lb 3.2 oz (79.5 kg)  ?11/02/19 172 lb 9.6 oz (78.3 kg)  ?04/25/19 180 lb (81.6 kg)  ?  ? ?GEN: Well nourished, well developed in no acute distress ?HEENT: Normal ?NECK: No JVD; No carotid bruits ?CARDIAC: Bradycardic, regular, no murmurs, rubs, gallops ?RESPIRATORY:  Clear to auscultation without rales, wheezing or rhonchi  ?ABDOMEN: Soft, non-tender, non-distended ?MUSCULOSKELETAL:  No edema; No deformity  ?SKIN: Warm and dry ?NEUROLOGIC:  Alert and oriented x 3 ?PSYCHIATRIC:  Normal affect  ? ?ASSESSMENT:   ? ?No diagnosis found. ? ?PLAN:   ? ?In order of problems listed above: ? ?#Nonischemic CM: ?#Chronic combined systolic and diastolic HF with recovered EF: ?Initial EF 20-25% in 2017 now recovered LVEF 04/2019 60-65%. Clinically euvolemic with NYHA class II symptoms. Tolerating all medications as prescribed. ?-Continue lasix 40mg  daily ?-Continue spironolactone 25mg  daily ?-Continue metop 25mg  XL daily ? ?#Non-obstructive CAD: ?No anginal symptoms.  ?-Continue ASA 81mg  daily ?-Continue crestor 40mg  daily ? ?#HLD: ?LDL goal <70. Last LDL 55 in 08/2020 ?-Repeat cholesterol today ?-Continue crestor 40mg  daily ? ?#Mild AR ?#Mild MR: ?-Serial TTE monitoring with next 2024 ? ?   ?Follow-up in 6 months. ? ?Medication Adjustments/Labs and Tests Ordered: ?Current medicines are reviewed at length with the patient today.  Concerns regarding medicines are outlined above.  ?No orders of the defined types were placed in this encounter. ? ?No orders of  the defined types were placed in this encounter. ? ? ?There are no Patient Instructions on file for this visit. ?  ? ?I,Mathew Stumpf,acting as a scribe for Freada Bergeron, MD.,have documented all

## 2021-07-17 ENCOUNTER — Encounter: Payer: Self-pay | Admitting: Cardiology

## 2021-07-17 ENCOUNTER — Ambulatory Visit (INDEPENDENT_AMBULATORY_CARE_PROVIDER_SITE_OTHER): Payer: Medicare Other | Admitting: Cardiology

## 2021-07-17 ENCOUNTER — Encounter (INDEPENDENT_AMBULATORY_CARE_PROVIDER_SITE_OTHER): Payer: Self-pay

## 2021-07-17 VITALS — BP 124/70 | HR 69 | Ht 62.0 in | Wt 183.0 lb

## 2021-07-17 DIAGNOSIS — I340 Nonrheumatic mitral (valve) insufficiency: Secondary | ICD-10-CM

## 2021-07-17 DIAGNOSIS — I428 Other cardiomyopathies: Secondary | ICD-10-CM | POA: Diagnosis not present

## 2021-07-17 DIAGNOSIS — I2510 Atherosclerotic heart disease of native coronary artery without angina pectoris: Secondary | ICD-10-CM

## 2021-07-17 DIAGNOSIS — E782 Mixed hyperlipidemia: Secondary | ICD-10-CM

## 2021-07-17 DIAGNOSIS — I10 Essential (primary) hypertension: Secondary | ICD-10-CM | POA: Diagnosis not present

## 2021-07-17 DIAGNOSIS — I5042 Chronic combined systolic (congestive) and diastolic (congestive) heart failure: Secondary | ICD-10-CM

## 2021-07-17 DIAGNOSIS — N1832 Chronic kidney disease, stage 3b: Secondary | ICD-10-CM

## 2021-07-17 DIAGNOSIS — Z79899 Other long term (current) drug therapy: Secondary | ICD-10-CM

## 2021-07-17 DIAGNOSIS — I351 Nonrheumatic aortic (valve) insufficiency: Secondary | ICD-10-CM

## 2021-07-17 MED ORDER — SPIRONOLACTONE 25 MG PO TABS: ORAL_TABLET | ORAL | 3 refills | Status: DC

## 2021-07-17 NOTE — Patient Instructions (Signed)
Medication Instructions:  ? ?Your physician recommends that you continue on your current medications as directed. Please refer to the Current Medication list given to you today. ? ?*If you need a refill on your cardiac medications before your next appointment, please call your pharmacy* ? ? ?Testing/Procedures: ? ?Your physician has requested that you have an echocardiogram. Echocardiography is a painless test that uses sound waves to create images of your heart. It provides your doctor with information about the size and shape of your heart and how well your heart?s chambers and valves are working. This procedure takes approximately one hour. There are no restrictions for this procedure.  SCHEDULE TO BE DONE IN FEB 2024 ? ? ? ?Follow-Up: ?At [facility], you and your health needs are our priority.  As part of our continuing mission to provide you with exceptional heart care, we have created designated Provider Care Teams.  These Care Teams include your primary Cardiologist (physician) and Advanced Practice Providers (APPs -  Physician Assistants and Nurse Practitioners) who all work together to provide you with the care you need, when you need it. ? ?We recommend signing up for the patient portal called "MyChart".  Sign up information is provided on this After Visit Summary.  MyChart is used to connect with patients for Virtual Visits (Telemedicine).  Patients are able to view lab/test results, encounter notes, upcoming appointments, etc.  Non-urgent messages can be sent to your provider as well.   ?To learn more about what you can do with MyChart, go to [email or web address].   ? ?Your next appointment:   ?1 year(s) ? ?The format for your next appointment:   ?In Person ? ?Provider:   ?DR. PEMBERTON  ? ?Important Information About Sugar ? ? ? ? ? ? ?

## 2021-07-17 NOTE — Progress Notes (Signed)
?Cardiology Office Note:   ? ?Date:  07/17/2021  ? ?ID:  Jennifer Kaufman, DOB [birth year], MRN [id number] ? ?PCP:  Algis Greenhouse, MD ?  ?[facility] Providers ?Cardiologist:  Ena Dawley, MD { ? ?Referring MD: Algis Greenhouse, MD  ? ? ?History of Present Illness:   ? ?Jennifer Kaufman is a 76 y.o. 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 Meda Coffee who now presents to clinic for follow-up. ? ?Per review of the record, patient has history of  echocardiogram in 06/2015 with EF 20-25% with diffuse hypokinesis. [facility] 06/2015 showed 65% mRCA lesion (not physiologically significant by FFR), elevated LVEDP, NICM. She was treated with medical therapy. 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 Last stress test  11/11/18 showing no ischemia, but prior infarct and EF 43%.Last echo 04/2019 showed LVEF of 60-65% and grade 1 DD.  ? ?Was last seen in clinic in 08/2020 where she was doing well from a CV standpoint.  ? ?Today, the patient states that she is feeling okay. Every now and then she experiences a "floaty feeling" in her chest when she is exerting and active. She notes that she would not consider this as shortness of breath. She also denies any chest pain or palpitations. ? ?She has felt lightheaded or dizzy at times, but this is not frequent. ? ?At home she reports stable blood pressures, similar to today's in clinic reading of 124/70. ? ?Her recent labs were reviewed, showing her LDL 70. ? ?She denies any peripheral edema. No headaches, syncope, orthopnea, or PND. ? ? ?Past Medical History:  ?Diagnosis Date  ? Anemia   ? Arthritis   ? "all over"  ? CAD (coronary artery disease)   ? a. 65% RCA lesion in 2017 cath, nonobstructive.  ? Chronic combined systolic and diastolic CHF (congestive heart failure) ([place])   ? CKD  (chronic kidney disease), stage III ([place])   ? Gout   ? High cholesterol   ? History of blood transfusion   ? 'w/hysterectomy"  ? Hypertension   ? Mild aortic insufficiency   ? Mild mitral regurgitation   ? Nonischemic cardiomyopathy ([place])   ? a. echo 06/22/15 EF of 20-25% with akinesis of the anterior wall b. R & L cath 06/25/15 -nonobstructive RCA (65%) lesion by FFR, Elevated LVEDP --> medical managment   ? Type II diabetes mellitus ([place])   ? ? ?Past Surgical History:  ?Procedure Laterality Date  ? ABDOMINAL HYSTERECTOMY    ? CARDIAC CATHETERIZATION N/A 06/25/2015  ? Procedure: Right/Left Heart Cath and Coronary Angiography;  Surgeon: Leonie Man, MD;  Location: [facility];  Service: Cardiovascular;  Laterality: N/A;  ? CARPAL TUNNEL RELEASE Bilateral   ? JOINT REPLACEMENT    ? MENISCUS REPAIR Left   ? "scoped"  ? THYROID LOBECTOMY    ? TOTAL HIP ARTHROPLASTY Left 2000  ? TUBAL LIGATION    ? ? ?Current Medications: ?Current Meds  ?Medication Sig  ? aspirin EC 81 MG EC tablet Take 1 tablet (81 mg total) by mouth daily.  ? furosemide (LASIX) 40 MG tablet Take 1 tablet (40 mg total) by mouth as needed. Please make yearly appt with Dr. Johney Frame in June 2023 for future refills. Thank you 1st attempt  ? metoprolol succinate (TOPROL-XL) 25 MG 24  hr tablet TAKE 1/2 TABLET(12.5 MG) BY MOUTH DAILY  ? nitroGLYCERIN (NITROSTAT) 0.4 MG SL tablet DISSOLVE 1 TABLET UNDER THE TONGUE EVERY 5 MINUTES AS NEEDED FOR CHEST PAIN  ? rosuvastatin (CRESTOR) 40 MG tablet Take 40 mg by mouth daily.  ? ULORIC 40 MG tablet Take 40 mg by mouth daily.   ? [DISCONTINUED] spironolactone (ALDACTONE) 25 MG tablet TAKE 1 TABLET(25 MG) BY MOUTH TWICE DAILY  ?  ? ?Allergies:   Atorvastatin, Carvedilol, Hydrochlorothiazide, and Lisinopril  ? ?Social History  ? ?Socioeconomic History  ? Marital status: Widowed  ?  Spouse name: Not on file  ? Number of children: Not on file  ? Years of education: Not on file  ? Highest education level: Not on  file  ?Occupational History  ? Not on file  ?Tobacco Use  ? Smoking status: Never  ? Smokeless tobacco: Current  ?  Types: Snuff  ?Vaping Use  ? Vaping Use: Never used  ?Substance and Sexual Activity  ? Alcohol use: No  ? Drug use: No  ? Sexual activity: Never  ?Other Topics Concern  ? Not on file  ?Social History Narrative  ? Not on file  ? ?Social Determinants of Health  ? ?Financial Resource Strain: Not on file  ?Food Insecurity: Not on file  ?Transportation Needs: Not on file  ?Physical Activity: Not on file  ?Stress: Not on file  ?Social Connections: Not on file  ?  ? ?Family History: ?The patient's family history includes Hypertension in her mother. ? ?ROS:   ?Review of Systems  ?Constitutional:  Negative for chills, diaphoresis and fever.  ?HENT:  Negative for ear pain and nosebleeds.   ?Eyes:  Negative for blurred vision and double vision.  ?Respiratory:  Negative for cough and shortness of breath.   ?Cardiovascular:  Negative for chest pain, palpitations, orthopnea, claudication, leg swelling and PND.  ?Gastrointestinal:  Negative for heartburn, melena and nausea.  ?Genitourinary:  Negative for dysuria and urgency.  ?Musculoskeletal:  Negative for myalgias and neck pain.  ?Neurological:  Positive for dizziness. Negative for weakness and headaches.  ?Endo/Heme/Allergies:  Negative for environmental allergies.  ?Psychiatric/Behavioral:  Negative for depression and suicidal ideas.   ? ? ?EKGs/Labs/Other Studies Reviewed:   ? ?The following studies were reviewed today: ? ?TTE 04/2019: ?IMPRESSIONS  ? ? 1. Left ventricular ejection fraction, by estimation, is 60 to 65%. The  ?left ventricle has normal function. The left ventricle has no regional  ?wall motion abnormalities. There is moderate concentric left ventricular  ?hypertrophy. Left ventricular  ?diastolic parameters are consistent with Grade I diastolic dysfunction  ?(impaired relaxation). The average left ventricular global longitudinal  ?strain is -16.0  %.  ? 2. Right ventricular systolic function is normal. The right ventricular  ?size is normal. There is normal pulmonary artery systolic pressure. The  ?estimated right ventricular systolic pressure is 123XX123 mmHg.  ? 3. Left atrial size was mildly dilated.  ? 4. The mitral valve is normal in structure and function. Mild mitral  ?valve regurgitation. No evidence of mitral stenosis.  ? 5. The aortic valve is normal in structure and function. Aortic valve  ?regurgitation is mild. No aortic stenosis is present.  ? 6. The inferior vena cava is normal in size with greater than 50%  ?respiratory variability, suggesting right atrial pressure of 3 mmHg.  ? ?Myoview 11/11/18: ?EKG non-conclusive due to resting ST depressions ?Nuclear stress EF: 43%. ?The left ventricular ejection fraction is moderately decreased (30-44%). ?Defect 1:  There is a small fixed defect present in the apical inferior and apex location. This is consistent with an infarct ?Findings consistent with prior myocardial infarction. ?This is a low risk study. ? ?Heart Cath 06/25/2015: ?Mid RCA lesion, 65% stenosed. FFR non-physiologically significant ?No severe cardiomyopathy, likely nonischemic ?Elevated LVEDP is consistent with Pulmonary Capillary Wedge Pressure monitoring. ?  ?Nonobstructive RCA lesion by FFR ?Nonischemic Cardiomyopathy. ?  ?Plan: ?Return to nursing unit after sheath removal. ?Continue IV diuresis and optimization of medical management. ?Would also treat for noneffective CAD with statin and aspirin. ? ? ?EKG:  EKG is personally reviewed. ?07/17/2021: Sinus rhythm. Rate 69 bpm. ?09/10/2020: Sinus bradycardia, rate 58, poor R wave progression ? ?Recent Labs: ?No results found for requested labs within last 8760 hours.  ? ?Recent Lipid Panel ?   ?Component Value Date/Time  ? CHOL 113 09/10/2020 1038  ? TRIG 64 09/10/2020 1038  ? HDL 44 09/10/2020 1038  ? CHOLHDL 2.6 09/10/2020 1038  ? CHOLHDL 3.6 06/23/2015 0336  ? VLDL 23 06/23/2015 0336  ?  [place] 55 09/10/2020 1038  ? ? ? ?Risk Assessment/Calculations:   ?  ? ? ?Physical Exam:   ? ?VS:  BP 124/70   Pulse 69   Ht 5\' 2"  (1.575 m)   Wt 183 lb (83 kg)   SpO2 97%   BMI 33.47 kg/m?    ? ?Wt Read

## 2021-11-01 ENCOUNTER — Other Ambulatory Visit: Payer: Self-pay

## 2021-11-01 DIAGNOSIS — I2510 Atherosclerotic heart disease of native coronary artery without angina pectoris: Secondary | ICD-10-CM

## 2021-11-01 DIAGNOSIS — Z79899 Other long term (current) drug therapy: Secondary | ICD-10-CM

## 2021-11-01 DIAGNOSIS — I428 Other cardiomyopathies: Secondary | ICD-10-CM

## 2021-11-01 DIAGNOSIS — N1832 Chronic kidney disease, stage 3b: Secondary | ICD-10-CM

## 2021-11-01 MED ORDER — METOPROLOL SUCCINATE ER 25 MG PO TB24: 12.5000 mg | ORAL_TABLET | Freq: Every day | ORAL | 2 refills | Status: DC

## 2022-01-27 ENCOUNTER — Other Ambulatory Visit: Payer: Self-pay

## 2022-01-27 DIAGNOSIS — I2510 Atherosclerotic heart disease of native coronary artery without angina pectoris: Secondary | ICD-10-CM

## 2022-01-27 DIAGNOSIS — Z79899 Other long term (current) drug therapy: Secondary | ICD-10-CM

## 2022-01-27 DIAGNOSIS — N1832 Chronic kidney disease, stage 3b: Secondary | ICD-10-CM

## 2022-01-27 DIAGNOSIS — I428 Other cardiomyopathies: Secondary | ICD-10-CM

## 2022-01-27 MED ORDER — SPIRONOLACTONE 25 MG PO TABS: ORAL_TABLET | ORAL | 1 refills | Status: DC

## 2022-04-17 ENCOUNTER — Other Ambulatory Visit (HOSPITAL_COMMUNITY): Payer: Medicare Other

## 2022-05-01 ENCOUNTER — Ambulatory Visit (HOSPITAL_COMMUNITY): Payer: 59

## 2022-05-02 ENCOUNTER — Ambulatory Visit (HOSPITAL_COMMUNITY): Payer: 59 | Attending: Cardiology

## 2022-05-02 DIAGNOSIS — I351 Nonrheumatic aortic (valve) insufficiency: Secondary | ICD-10-CM | POA: Diagnosis present

## 2022-05-02 DIAGNOSIS — I340 Nonrheumatic mitral (valve) insufficiency: Secondary | ICD-10-CM

## 2022-05-02 LAB — ECHOCARDIOGRAM COMPLETE
Area-P 1/2: 2.08 cm2
Calc EF: 56.8 %
P 1/2 time: 438 msec
S' Lateral: 2.6 cm
Single Plane A2C EF: 49.8 %
Single Plane A4C EF: 61.4 %

## 2022-05-05 ENCOUNTER — Telehealth: Payer: Self-pay | Admitting: *Deleted

## 2022-05-05 DIAGNOSIS — I428 Other cardiomyopathies: Secondary | ICD-10-CM

## 2022-05-05 DIAGNOSIS — N1832 Chronic kidney disease, stage 3b: Secondary | ICD-10-CM

## 2022-05-05 DIAGNOSIS — Z79899 Other long term (current) drug therapy: Secondary | ICD-10-CM

## 2022-05-05 DIAGNOSIS — I2510 Atherosclerotic heart disease of native coronary artery without angina pectoris: Secondary | ICD-10-CM

## 2022-05-05 MED ORDER — FUROSEMIDE 40 MG PO TABS: 40.0000 mg | ORAL_TABLET | ORAL | 4 refills | Status: DC | PRN

## 2022-05-05 MED ORDER — METOPROLOL SUCCINATE ER 25 MG PO TB24: 12.5000 mg | ORAL_TABLET | Freq: Every day | ORAL | 2 refills | Status: DC

## 2022-05-05 MED ORDER — SPIRONOLACTONE 25 MG PO TABS: ORAL_TABLET | ORAL | 4 refills | Status: DC

## 2022-05-05 MED ORDER — ROSUVASTATIN CALCIUM 40 MG PO TABS: 40.0000 mg | ORAL_TABLET | Freq: Every day | ORAL | 1 refills | Status: DC

## 2022-05-05 NOTE — Telephone Encounter (Signed)
-----   Message from Freada Bergeron, MD sent at 05/05/2022 12:45 PM EST ----- Her echo shows normal pumping function. She has moderate thickening of her heart muscle which means we just need to ensure her blood pressure is well controlled going forward. Her aortic valve leaks a mild amount, which we will monitor with repeat echoes going forward.

## 2022-05-05 NOTE — Telephone Encounter (Signed)
The patient has been notified of the result and verbalized understanding.  All questions (if any) were answered.  Pt stated she needed refills of all her cardiac meds, for she is about to run out.  Confirmed the pharmacy of choice with the pt.  Sent cardiac medication refills to the pts pharmacy of choice.

## 2022-09-15 ENCOUNTER — Encounter: Payer: Self-pay | Admitting: Physician Assistant

## 2022-09-15 ENCOUNTER — Ambulatory Visit: Payer: 59 | Attending: Physician Assistant | Admitting: Physician Assistant

## 2022-09-15 VITALS — BP 136/70 | HR 61 | Ht 62.0 in | Wt 170.8 lb

## 2022-09-15 DIAGNOSIS — I428 Other cardiomyopathies: Secondary | ICD-10-CM | POA: Diagnosis not present

## 2022-09-15 DIAGNOSIS — E782 Mixed hyperlipidemia: Secondary | ICD-10-CM

## 2022-09-15 DIAGNOSIS — I2510 Atherosclerotic heart disease of native coronary artery without angina pectoris: Secondary | ICD-10-CM

## 2022-09-15 DIAGNOSIS — I10 Essential (primary) hypertension: Secondary | ICD-10-CM

## 2022-09-15 DIAGNOSIS — Z79899 Other long term (current) drug therapy: Secondary | ICD-10-CM | POA: Diagnosis not present

## 2022-09-15 DIAGNOSIS — I351 Nonrheumatic aortic (valve) insufficiency: Secondary | ICD-10-CM

## 2022-09-15 DIAGNOSIS — I340 Nonrheumatic mitral (valve) insufficiency: Secondary | ICD-10-CM

## 2022-09-15 DIAGNOSIS — N1832 Chronic kidney disease, stage 3b: Secondary | ICD-10-CM

## 2022-09-15 DIAGNOSIS — I5042 Chronic combined systolic (congestive) and diastolic (congestive) heart failure: Secondary | ICD-10-CM

## 2022-09-15 MED ORDER — SPIRONOLACTONE 25 MG PO TABS: ORAL_TABLET | ORAL | 3 refills | Status: DC

## 2022-09-15 MED ORDER — NITROGLYCERIN 0.4 MG SL SUBL: SUBLINGUAL_TABLET | SUBLINGUAL | 3 refills | Status: DC

## 2022-09-15 MED ORDER — ROSUVASTATIN CALCIUM 40 MG PO TABS: 40.0000 mg | ORAL_TABLET | Freq: Every day | ORAL | 3 refills | Status: DC

## 2022-09-15 MED ORDER — FUROSEMIDE 40 MG PO TABS: 40.0000 mg | ORAL_TABLET | ORAL | 4 refills | Status: DC | PRN

## 2022-09-15 MED ORDER — METOPROLOL SUCCINATE ER 25 MG PO TB24: 12.5000 mg | ORAL_TABLET | Freq: Every day | ORAL | 3 refills | Status: DC

## 2022-09-15 NOTE — Progress Notes (Signed)
Cardiology Office Note:  .   Date:  09/15/2022  ID:  Jennifer Kaufman, DOB 1945/07/21, MRN [id number] PCP: Olive Bass, MD  [facility] Providers Cardiologist:  Tobias Alexander, MD {  History of Present Illness: .   Jennifer Kaufman is a 77 y.o. 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 Shari Prows here for follow-up appointment.  Patient's history includes echocardiogram 06/2015 with EF 20 to 25% with diffuse hypokinesis.  [facility] 06/2015 showed 65% RCA lesion (not physiologically significant by FFR), elevated LVEDP, and ICM.  Treated medically.  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  Last stress test 11/11/2018 showed no ischemia, but prior infarct and EF 43%.  Last echo 2/21 showed LVEF 66 5% and grade 1 DD.  Was seen 08/2020 where she was doing well from a CV standpoint.  She was seen 07/2021 by Dr. Shari Prows and she endorsed a "floaty feeling" when she exerted herself.  This is still the case today.  She also denies shortness of breath, chest pain, and palpitations.  Felt lightheaded and dizziness at times but infrequent.  At home reported stable blood pressures then.  Blood pressure is also stable today.  Labs reviewed and LDL 55, at goal.  Reports no shortness of breath nor dyspnea on exertion. Reports no chest pain, pressure, or tightness. No edema, orthopnea, PND. Reports no palpitations.   ROS: Pertinent ROS in HPI  Studies Reviewed: Marland Kitchen   EKG Interpretation Date/Time:  Monday September 15 2022 15:10:38 EDT Ventricular Rate:  61 PR Interval:  206 QRS Duration:  104 QT Interval:  408 QTC Calculation: 410 R Axis:   -39  Text Interpretation: Normal sinus rhythm Left axis deviation Left ventricular hypertrophy with repolarization abnormality ( R in aVL , [employer] product ) When  compared with ECG of 22-Jun-2015 13:27, PREVIOUS ECG IS PRESENT Confirmed by Jari Favre [phone]) on 09/15/2022 3:24:38 PM   IMPRESSIONS  Echo 05/02/22   1. Left ventricular ejection fraction, by estimation, is 55 to 60%. The  left ventricle has normal function. The left ventricle has no regional  wall motion abnormalities. There is mild concentric left ventricular  hypertrophy with moderate hypertrophy of  the basilar septum. Left ventricular diastolic parameters are consistent  with Grade I diastolic dysfunction (impaired relaxation).   2. Right ventricular systolic function is normal. The right ventricular  size is normal. There is normal pulmonary artery systolic pressure.   3. The mitral valve is normal in structure. Trivial mitral valve  regurgitation. No evidence of mitral stenosis.   4. The aortic valve was not well visualized. Aortic valve regurgitation  is mild. No aortic stenosis is present.   5. The inferior vena cava is normal in size with greater than 50%  respiratory variability, suggesting right atrial pressure of 3 mmHg.   FINDINGS   Left Ventricle: Left ventricular ejection fraction, by estimation, is 55  to 60%. The left ventricle has normal function. The left ventricle has no  regional wall motion abnormalities. The left ventricular internal cavity  size was normal in size. There is   mild concentric left ventricular hypertrophy. Left ventricular diastolic  parameters are consistent with Grade I diastolic dysfunction (impaired  relaxation).   Right Ventricle: The right ventricular size is normal. No increase in  right ventricular wall  thickness. Right ventricular systolic function is  normal. There is normal pulmonary artery systolic pressure. The tricuspid  regurgitant velocity is 1.59 m/s, and   with an assumed right atrial pressure of 3 mmHg, the estimated right  ventricular systolic pressure is 13.1 mmHg.   Left Atrium: Left atrial size was normal in size.    Right Atrium: Right atrial size was normal in size.   Pericardium: There is no evidence of pericardial effusion.   Mitral Valve: The mitral valve is normal in structure. Trivial mitral  valve regurgitation. No evidence of mitral valve stenosis.   Tricuspid Valve: The tricuspid valve is normal in structure. Tricuspid  valve regurgitation is not demonstrated. No evidence of tricuspid  stenosis.   Aortic Valve: The aortic valve was not well visualized. Aortic valve  regurgitation is mild. Aortic regurgitation PHT measures 438 msec. No  aortic stenosis is present.   Pulmonic Valve: The pulmonic valve was normal in structure. Pulmonic valve  regurgitation is mild. No evidence of pulmonic stenosis.   Aorta: The aortic root is normal in size and structure.   Venous: The inferior vena cava is normal in size with greater than 50%  respiratory variability, suggesting right atrial pressure of 3 mmHg.   IAS/Shunts: No atrial level shunt detected by color flow Doppler.       Physical Exam:   VS:  BP 136/70   Pulse 61   Ht 5\' 2"  (1.575 m)   Wt 170 lb 12.8 oz (77.5 kg)   SpO2 96%   BMI 31.24 kg/m    Wt Readings from Last 3 Encounters:  09/15/22 170 lb 12.8 oz (77.5 kg)  07/17/21 183 lb (83 kg)  09/10/20 175 lb 3.2 oz (79.5 kg)    GEN: Well nourished, well developed in no acute distress NECK: No JVD; No carotid bruits CARDIAC: RRR, no murmurs, rubs, gallops RESPIRATORY:  Clear to auscultation without rales, wheezing or rhonchi  ABDOMEN: Soft, non-tender, non-distended EXTREMITIES:  No edema; No deformity   ASSESSMENT AND PLAN: .   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 -LVEF 20 to 25% in 2017 which recovered to 60 to 65% in 2021 -Hemodynamically stable today and euvolemic on exam -Continue Lasix 40 mg daily -Continue spironolactone 25 mg daily -Continue metoprolol 12.5 mg XL daily  Nonobstructive CAD -No chest pain or  shortness of breath -Continue aspirin 81 mg daily and Crestor 40 mg daily  HLD -LDL goal less than 70, LDL 111 when last checked -Will need repeat lipid panel and possible medication titration at next visit  Mild AR/mild MR -Recent echocardiogram reviewed with the patient -Asymptomatic at this time  CKD 3B -Creatinine 1.16 -Continue to avoid nephrotoxic medications      Dispo: Follow-up in 1 year  Signed, Sharlene Dory, PA-C

## 2022-09-15 NOTE — Patient Instructions (Signed)
Medication Instructions:  Your physician recommends that you continue on your current medications as directed. Please refer to the Current Medication list given to you today.  *If you need a refill on your cardiac medications before your next appointment, please call your pharmacy*   Lab Work: NONE If you have labs (blood work) drawn today and your tests are completely normal, you will receive your results only by: MyChart Message (if you have MyChart) OR A paper copy in the mail If you have any lab test that is abnormal or we need to change your treatment, we will call you to review the results.   Testing/Procedures: NONE   Follow-Up: At [facility], you and your health needs are our priority.  As part of our continuing mission to provide you with exceptional heart care, we have created designated Provider Care Teams.  These Care Teams include your primary Cardiologist (physician) and Advanced Practice Providers (APPs -  Physician Assistants and Nurse Practitioners) who all work together to provide you with the care you need, when you need it.  We recommend signing up for the patient portal called "MyChart".  Sign up information is provided on this After Visit Summary.  MyChart is used to connect with patients for Virtual Visits (Telemedicine).  Patients are able to view lab/test results, encounter notes, upcoming appointments, etc.  Non-urgent messages can be sent to your provider as well.   To learn more about what you can do with MyChart, go to [email or web address].    Your next appointment:   1 year(s)  Provider:   DR. Lynnette Caffey  Other Instructions Heart-Healthy Eating Plan Eating a healthy diet is important for the health of your heart. A heart-healthy eating plan includes: Eating less unhealthy fats. Eating more healthy fats. Eating less salt in your food. Salt is also called sodium. Making other changes in your diet. Talk with your doctor or a diet specialist  (dietitian) to create an eating plan that is right for you. What is my plan? Your doctor may recommend an eating plan that includes: Total fat: ______% or less of total calories a day. Saturated fat: ______% or less of total calories a day. Cholesterol: less than _________mg a day. Sodium: less than _________mg a day. What are tips for following this plan? Cooking Avoid frying your food. Try to bake, boil, grill, or broil it instead. You can also reduce fat by: Removing the skin from poultry. Removing all visible fats from meats. Steaming vegetables in water or broth. Meal planning  At meals, divide your plate into four equal parts: Fill one-half of your plate with vegetables and green salads. Fill one-fourth of your plate with whole grains. Fill one-fourth of your plate with lean protein foods. Eat 2-4 cups of vegetables per day. One cup of vegetables is: 1 cup (91 g) broccoli or cauliflower florets. 2 medium carrots. 1 large bell pepper. 1 large sweet potato. 1 large tomato. 1 medium white potato. 2 cups (150 g) raw leafy greens. Eat 1-2 cups of fruit per day. One cup of fruit is: 1 small apple 1 large banana 1 cup (237 g) mixed fruit, 1 large orange,  cup (82 g) dried fruit, 1 cup (240 mL) 100% fruit juice. Eat more foods that have soluble fiber. These are apples, broccoli, carrots, beans, peas, and barley. Try to get 20-30 g of fiber per day. Eat 4-5 servings of nuts, legumes, and seeds per week: 1 serving of dried beans or legumes equals  cup (90  g) cooked. 1 serving of nuts is  oz (12 almonds, 24 pistachios, or 7 walnut halves). 1 serving of seeds equals  oz (8 g). General information Eat more home-cooked food. Eat less restaurant, buffet, and fast food. Limit or avoid alcohol. Limit foods that are high in starch and sugar. Avoid fried foods. Lose weight if you are overweight. Keep track of how much salt (sodium) you eat. This is important if you have high  blood pressure. Ask your doctor to tell you more about this. Try to add vegetarian meals each week. Fats Choose healthy fats. These include olive oil and canola oil, flaxseeds, walnuts, almonds, and seeds. Eat more omega-3 fats. These include salmon, mackerel, sardines, tuna, flaxseed oil, and ground flaxseeds. Try to eat fish at least 2 times each week. Check food labels. Avoid foods with trans fats or high amounts of saturated fat. Limit saturated fats. These are often found in animal products, such as meats, butter, and cream. These are also found in plant foods, such as palm oil, palm kernel oil, and coconut oil. Avoid foods with partially hydrogenated oils in them. These have trans fats. Examples are stick margarine, some tub margarines, cookies, crackers, and other baked goods. What foods should I eat? Fruits All fresh, canned (in natural juice), or frozen fruits. Vegetables Fresh or frozen vegetables (raw, steamed, roasted, or grilled). Green salads. Grains Most grains. Choose whole wheat and whole grains most of the time. Rice and pasta, including brown rice and pastas made with whole wheat. Meats and other proteins Lean, well-trimmed beef, veal, pork, and lamb. Chicken and [place] without skin. All fish and shellfish. Wild duck, rabbit, pheasant, and venison. Egg whites or low-cholesterol egg substitutes. Dried beans, peas, lentils, and tofu. Seeds and most nuts. Dairy Low-fat or nonfat cheeses, including ricotta and mozzarella. Skim or 1% milk that is liquid, powdered, or evaporated. Buttermilk that is made with low-fat milk. Nonfat or low-fat yogurt. Fats and oils Non-hydrogenated (trans-free) margarines. Vegetable oils, including soybean, sesame, sunflower, olive, peanut, safflower, corn, canola, and cottonseed. Salad dressings or mayonnaise made with a vegetable oil. Beverages Mineral water. Coffee and tea. Diet carbonated beverages. Sweets and desserts Sherbet, gelatin, and  fruit ice. Small amounts of dark chocolate. Limit all sweets and desserts. Seasonings and condiments All seasonings and condiments. The items listed above may not be a complete list of foods and drinks you can eat. Contact a dietitian for more options. What foods should I avoid? Fruits Canned fruit in heavy syrup. Fruit in cream or butter sauce. Fried fruit. Limit coconut. Vegetables Vegetables cooked in cheese, cream, or butter sauce. Fried vegetables. Grains Breads that are made with saturated or trans fats, oils, or whole milk. Croissants. Sweet rolls. Donuts. High-fat crackers, such as cheese crackers. Meats and other proteins Fatty meats, such as hot dogs, ribs, sausage, bacon, rib-eye roast or steak. High-fat deli meats, such as salami and bologna. Caviar. Domestic duck and goose. Organ meats, such as liver. Dairy Cream, sour cream, cream cheese, and creamed cottage cheese. Whole-milk cheeses. Whole or 2% milk that is liquid, evaporated, or condensed. Whole buttermilk. Cream sauce or high-fat cheese sauce. Yogurt that is made from whole milk. Fats and oils Meat fat, or shortening. Cocoa butter, hydrogenated oils, palm oil, coconut oil, palm kernel oil. Solid fats and shortenings, including bacon fat, salt pork, lard, and butter. Nondairy cream substitutes. Salad dressings with cheese or sour cream. Beverages Regular sodas and juice drinks with added sugar. Sweets and desserts Frosting.  Pudding. Cookies. Cakes. Pies. Milk chocolate or white chocolate. Buttered syrups. Full-fat ice cream or ice cream drinks. The items listed above may not be a complete list of foods and drinks to avoid. Contact a dietitian for more information. Summary Heart-healthy meal planning includes eating less unhealthy fats, eating more healthy fats, and making other changes in your diet. Eat a balanced diet. This includes fruits and vegetables, low-fat or nonfat dairy, lean protein, nuts and legumes, whole  grains, and heart-healthy oils and fats. This information is not intended to replace advice given to you by your health care provider. Make sure you discuss any questions you have with your health care provider. Document Revised: 04/08/2021 Document Reviewed: 04/08/2021 Elsevier Patient Education  2024 Else[employer] DASH Eating Plan DASH stands for Dietary Approaches to Stop Hypertension. The DASH eating plan is a healthy eating plan that has been shown to: Lower high blood pressure (hypertension). Reduce your risk for type 2 diabetes, heart disease, and stroke. Help with weight loss. What are tips for following this plan? Reading food labels Check food labels for the amount of salt (sodium) per serving. Choose foods with less than 5 percent of the Daily Value (DV) of sodium. In general, foods with less than 300 milligrams (mg) of sodium per serving fit into this eating plan. To find whole grains, look for the word "whole" as the first word in the ingredient list. Shopping Buy products labeled as "low-sodium" or "no salt added." Buy fresh foods. Avoid canned foods and pre-made or frozen meals. Cooking Try not to add salt when you cook. Use salt-free seasonings or herbs instead of table salt or sea salt. Check with your health care provider or pharmacist before using salt substitutes. Do not fry foods. Cook foods in healthy ways, such as baking, boiling, grilling, roasting, or broiling. Cook using oils that are good for your heart. These include olive, canola, avocado, soybean, and sunflower oil. Meal planning  Eat a balanced diet. This should include: 4 or more servings of fruits and 4 or more servings of vegetables each day. Try to fill half of your plate with fruits and vegetables. 6-8 servings of whole grains each day. 6 or less servings of lean meat, poultry, or fish each day. 1 oz is 1 serving. A 3 oz (85 g) serving of meat is about the same size as the palm of your hand. One egg is 1  oz (28 g). 2-3 servings of low-fat dairy each day. One serving is 1 cup (237 mL). 1 serving of nuts, seeds, or beans 5 times each week. 2-3 servings of heart-healthy fats. Healthy fats called omega-3 fatty acids are found in foods such as walnuts, flaxseeds, fortified milks, and eggs. These fats are also found in cold-water fish, such as sardines, salmon, and mackerel. Limit how much you eat of: Canned or prepackaged foods. Food that is high in trans fat, such as fried foods. Food that is high in saturated fat, such as fatty meat. Desserts and other sweets, sugary drinks, and other foods with added sugar. Full-fat dairy products. Do not salt foods before eating. Do not eat more than 4 egg yolks a week. Try to eat at least 2 vegetarian meals a week. Eat more home-cooked food and less restaurant, buffet, and fast food. Lifestyle When eating at a restaurant, ask if your food can be made with less salt or no salt. If you drink alcohol: Limit how much you have to: 0-1 drink a day if you  are female. 0-2 drinks a day if you are female. Know how much alcohol is in your drink. In the [place] one drink is one 12 oz bottle of beer (355 mL), one 5 oz glass of wine (148 mL), or one 1 oz glass of hard liquor (44 mL). General information Avoid eating more than 2,300 mg of salt a day. If you have hypertension, you may need to reduce your sodium intake to 1,500 mg a day. Work with your provider to stay at a healthy body weight or lose weight. Ask what the best weight range is for you. On most days of the week, get at least 30 minutes of exercise that causes your heart to beat faster. This may include walking, swimming, or biking. Work with your provider or dietitian to adjust your eating plan to meet your specific calorie needs. What foods should I eat? Fruits All fresh, dried, or frozen fruit. Canned fruits that are in their natural juice and do not have sugar added to them. Vegetables Fresh or frozen  vegetables that are raw, steamed, roasted, or grilled. Low-sodium or reduced-sodium tomato and vegetable juice. Low-sodium or reduced-sodium tomato sauce and tomato paste. Low-sodium or reduced-sodium canned vegetables. Grains Whole-grain or whole-wheat bread. Whole-grain or whole-wheat pasta. Brown rice. Orpah Cobb. Bulgur. Whole-grain and low-sodium cereals. Pita bread. Low-fat, low-sodium crackers. Whole-wheat flour tortillas. Meats and other proteins Skinless chicken or [place]. Ground chicken or [place]. Pork with fat trimmed off. Fish and seafood. Egg whites. Dried beans, peas, or lentils. Unsalted nuts, nut butters, and seeds. Unsalted canned beans. Lean cuts of beef with fat trimmed off. Low-sodium, lean precooked or cured meat, such as sausages or meat loaves. Dairy Low-fat (1%) or fat-free (skim) milk. Reduced-fat, low-fat, or fat-free cheeses. Nonfat, low-sodium ricotta or cottage cheese. Low-fat or nonfat yogurt. Low-fat, low-sodium cheese. Fats and oils Soft margarine without trans fats. Vegetable oil. Reduced-fat, low-fat, or light mayonnaise and salad dressings (reduced-sodium). Canola, safflower, olive, avocado, soybean, and sunflower oils. Avocado. Seasonings and condiments Herbs. Spices. Seasoning mixes without salt. Other foods Unsalted popcorn and pretzels. Fat-free sweets. The items listed above may not be all the foods and drinks you can have. Talk to a dietitian to learn more. What foods should I avoid? Fruits Canned fruit in a light or heavy syrup. Fried fruit. Fruit in cream or butter sauce. Vegetables Creamed or fried vegetables. Vegetables in a cheese sauce. Regular canned vegetables that are not marked as low-sodium or reduced-sodium. Regular canned tomato sauce and paste that are not marked as low-sodium or reduced-sodium. Regular tomato and vegetable juices that are not marked as low-sodium or reduced-sodium. Rosita Fire. Olives. Grains Baked goods made with fat, such  as croissants, muffins, or some breads. Dry pasta or rice meal packs. Meats and other proteins Fatty cuts of meat. Ribs. Fried meat. Tomasa Blase. Bologna, salami, and other precooked or cured meats, such as sausages or meat loaves, that are not lean and low in sodium. Fat from the back of a pig (fatback). Bratwurst. Salted nuts and seeds. Canned beans with added salt. Canned or smoked fish. Whole eggs or egg yolks. Chicken or [place] with skin. Dairy Whole or 2% milk, cream, and half-and-half. Whole or full-fat cream cheese. Whole-fat or sweetened yogurt. Full-fat cheese. Nondairy creamers. Whipped toppings. Processed cheese and cheese spreads. Fats and oils Butter. Stick margarine. Lard. Shortening. Ghee. Bacon fat. Tropical oils, such as coconut, palm kernel, or palm oil. Seasonings and condiments Onion salt, garlic salt, seasoned salt, table salt, and  sea salt. Worcestershire sauce. Tartar sauce. Barbecue sauce. Teriyaki sauce. Soy sauce, including reduced-sodium soy sauce. Steak sauce. Canned and packaged gravies. Fish sauce. Oyster sauce. Cocktail sauce. Store-bought horseradish. Ketchup. Mustard. Meat flavorings and tenderizers. Bouillon cubes. Hot sauces. Pre-made or packaged marinades. Pre-made or packaged taco seasonings. Relishes. Regular salad dressings. Other foods Salted popcorn and pretzels. The items listed above may not be all the foods and drinks you should avoid. Talk to a dietitian to learn more. Where to find more information National Heart, Lung, and Blood Institute (NHLBI): BuffaloDryCleaner.gl American Heart Association (AHA): heart.org Academy of Nutrition and Dietetics: eatright.org National Kidney Foundation (NKF): kidney.org This information is not intended to replace advice given to you by your health care provider. Make sure you discuss any questions you have with your health care provider. Document Revised: 03/20/2022 Document Reviewed: 03/20/2022 Elsevier Patient Education  2024  [employer].

## 2023-02-24 ENCOUNTER — Telehealth: Payer: Self-pay | Admitting: Physician Assistant

## 2023-02-24 NOTE — Telephone Encounter (Signed)
*  STAT* If patient is at the pharmacy, call can be transferred to refill team.   1. Which medications need to be refilled? (please list name of each medication and dose if known)  metoprolol succinate (TOPROL-XL) 25 MG 24 hr tablet  2. Which pharmacy/location (including street and city if local pharmacy) is medication to be sent to? [facility] [phone] - [place], [place] - [address]  3. Do they need a 30 day or 90 day supply?   90 day supply  Daughter states patient is completely out of medication.

## 2023-05-29 ENCOUNTER — Encounter: Payer: Self-pay | Admitting: Internal Medicine

## 2023-05-29 ENCOUNTER — Telehealth: Payer: Self-pay | Admitting: *Deleted

## 2023-05-29 ENCOUNTER — Telehealth: Payer: Self-pay | Admitting: Physician Assistant

## 2023-05-29 ENCOUNTER — Other Ambulatory Visit: Payer: Self-pay | Admitting: *Deleted

## 2023-05-29 DIAGNOSIS — N1832 Chronic kidney disease, stage 3b: Secondary | ICD-10-CM

## 2023-05-29 DIAGNOSIS — Z79899 Other long term (current) drug therapy: Secondary | ICD-10-CM

## 2023-05-29 DIAGNOSIS — I428 Other cardiomyopathies: Secondary | ICD-10-CM

## 2023-05-29 DIAGNOSIS — I2510 Atherosclerotic heart disease of native coronary artery without angina pectoris: Secondary | ICD-10-CM

## 2023-05-29 MED ORDER — METOPROLOL SUCCINATE ER 25 MG PO TB24: 12.5000 mg | ORAL_TABLET | Freq: Every day | ORAL | 0 refills | Status: DC

## 2023-05-29 NOTE — Telephone Encounter (Signed)
 error

## 2023-05-29 NOTE — Telephone Encounter (Signed)
*  STAT* If patient is at the pharmacy, call can be transferred to refill team.   1. Which medications need to be refilled? (please list name of each medication and dose if known)   metoprolol succinate (TOPROL-XL) 25 MG 24 hr tablet   2. Which pharmacy/location (including street and city if local pharmacy) is medication to be sent to? [id number] - [place], [place] - [address] Phone: [phone]  Fax: [phone]     3. Do they need a 30 day or 90 day supply? 30 Pt's daughter has lost medication and needs a new  script sent in. She knows she will have to pay out of pocket. Pt is out of medication

## 2023-05-29 NOTE — Telephone Encounter (Signed)
 Patient's daughter requesting 30 day refill for Metoprolol succinate (Toprol-XL) 25 mg stated that medication was lost. Medication sent to [facility] in [place], [place].

## 2023-07-05 ENCOUNTER — Other Ambulatory Visit: Payer: Self-pay | Admitting: Physician Assistant

## 2023-07-25 ENCOUNTER — Other Ambulatory Visit: Payer: Self-pay | Admitting: Physician Assistant

## 2023-07-25 DIAGNOSIS — I2510 Atherosclerotic heart disease of native coronary artery without angina pectoris: Secondary | ICD-10-CM

## 2023-07-25 DIAGNOSIS — I428 Other cardiomyopathies: Secondary | ICD-10-CM

## 2023-07-25 DIAGNOSIS — N1832 Chronic kidney disease, stage 3b: Secondary | ICD-10-CM

## 2023-07-25 DIAGNOSIS — Z79899 Other long term (current) drug therapy: Secondary | ICD-10-CM

## 2023-09-30 NOTE — Progress Notes (Unsigned)
 Cardiology Office Note:  .   Date:  10/02/2023  ID:  Jennifer Kaufman, DOB 11/19/1945, MRN [id number] PCP: Jennifer Lamar CROME, MD  [facility] Providers Cardiologist:  None {  History of Present Illness: .   Jennifer Kaufman is a 78 y.o. 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 Hobart here for follow-up appointment.  Patient's history includes echocardiogram 06/2015 with EF 20 to 25% with diffuse hypokinesis.  [facility] 06/2015 showed 65% RCA lesion (not physiologically significant by FFR), elevated LVEDP, and ICM.  Treated medically.  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  Last stress test 11/11/2018 showed no ischemia, but prior infarct and EF 43%.  Last echo 2/21 showed LVEF 66 5% and grade 1 DD.  Was seen 08/2020 where she was doing well from a CV standpoint.  She was seen 07/2021 by Dr. Hobart and she endorsed a floaty feeling when she exerted herself.  This is still the case when she was seen by me in July of last year.  She also denies shortness of breath, chest pain, and palpitations.  Felt lightheaded and dizziness at times but infrequent.  At home reported stable blood pressures then.  Blood pressure is also stable today.  Labs reviewed and LDL 55, at goal.  Reports no shortness of breath nor dyspnea on exertion. Reports no chest pain, pressure, or tightness. No edema, orthopnea, PND. Reports no palpitations.   Today, she presents with a history of fluid overload and heart valve issues including dizziness and lightheadedness.  Dizziness and lightheadedness occur particularly during quick movements or activities in the kitchen. These episodes are infrequent and not associated with chest pain or dyspnea.  She has a mild leak of the aortic valve and previously reduced heart  pump function. An echocardiogram last year showed normal heart function with a thickened left ventricle.  She manages fluid intake, aiming for 64 ounces daily, and currently has no edema. Her medications include Lasix  twice weekly, spironolactone  25 mg twice daily, Crestor  40 mg, metoprolol  12.5 mg, and a daily baby aspirin .  Reports no shortness of breath nor dyspnea on exertion. Reports no chest pain, pressure, or tightness. No edema, orthopnea, PND. Reports no palpitations.   Discussed the use of AI scribe software for clinical note transcription with the patient, who gave verbal consent to proceed.  ROS: Pertinent ROS in HPI  Studies Reviewed: SABRA   EKG Interpretation Date/Time:  Friday October 02 2023 13:06:03 EDT Ventricular Rate:  67 PR Interval:  208 QRS Duration:  106 QT Interval:  374 QTC Calculation: 395 R Axis:   -49  Text Interpretation: Normal sinus rhythm Left axis deviation Moderate voltage criteria for LVH, may be normal variant ( R in aVL , Cornell product ) Nonspecific T wave abnormality When compared with ECG of 15-Sep-2022 15:10, No significant change was found Confirmed by Lucien Blanc [id number]) on 10/02/2023 1:31:33 PM   IMPRESSIONS  Echo 05/02/22   1. Left ventricular ejection fraction, by estimation, is 55 to 60%. The  left ventricle has normal function. The left ventricle has no regional  wall motion abnormalities. There is mild concentric left ventricular  hypertrophy with moderate hypertrophy of  the basilar septum. Left ventricular diastolic parameters are consistent  with Grade I diastolic dysfunction (impaired relaxation).   2.  Right ventricular systolic function is normal. The right ventricular  size is normal. There is normal pulmonary artery systolic pressure.   3. The mitral valve is normal in structure. Trivial mitral valve  regurgitation. No evidence of mitral stenosis.   4. The aortic valve was not well visualized. Aortic valve regurgitation  is mild.  No aortic stenosis is present.   5. The inferior vena cava is normal in size with greater than 50%  respiratory variability, suggesting right atrial pressure of 3 mmHg.   FINDINGS   Left Ventricle: Left ventricular ejection fraction, by estimation, is 55  to 60%. The left ventricle has normal function. The left ventricle has no  regional wall motion abnormalities. The left ventricular internal cavity  size was normal in size. There is   mild concentric left ventricular hypertrophy. Left ventricular diastolic  parameters are consistent with Grade I diastolic dysfunction (impaired  relaxation).   Right Ventricle: The right ventricular size is normal. No increase in  right ventricular wall thickness. Right ventricular systolic function is  normal. There is normal pulmonary artery systolic pressure. The tricuspid  regurgitant velocity is 1.59 m/s, and   with an assumed right atrial pressure of 3 mmHg, the estimated right  ventricular systolic pressure is 13.1 mmHg.   Left Atrium: Left atrial size was normal in size.   Right Atrium: Right atrial size was normal in size.   Pericardium: There is no evidence of pericardial effusion.   Mitral Valve: The mitral valve is normal in structure. Trivial mitral  valve regurgitation. No evidence of mitral valve stenosis.   Tricuspid Valve: The tricuspid valve is normal in structure. Tricuspid  valve regurgitation is not demonstrated. No evidence of tricuspid  stenosis.   Aortic Valve: The aortic valve was not well visualized. Aortic valve  regurgitation is mild. Aortic regurgitation PHT measures 438 msec. No  aortic stenosis is present.   Pulmonic Valve: The pulmonic valve was normal in structure. Pulmonic valve  regurgitation is mild. No evidence of pulmonic stenosis.   Aorta: The aortic root is normal in size and structure.   Venous: The inferior vena cava is normal in size with greater than 50%  respiratory variability, suggesting right  atrial pressure of 3 mmHg.   IAS/Shunts: No atrial level shunt detected by color flow Doppler.       Physical Exam:   VS:  BP 118/78   Pulse 67   Ht 5' 1 (1.549 m)   Wt 168 lb 12.8 oz (76.6 kg)   SpO2 98%   BMI 31.89 kg/m    Wt Readings from Last 3 Encounters:  10/02/23 168 lb 12.8 oz (76.6 kg)  09/15/22 170 lb 12.8 oz (77.5 kg)  07/17/21 183 lb (83 kg)    GEN: Well nourished, well developed in no acute distress NECK: No JVD; No carotid bruits CARDIAC: RRR, no murmurs, rubs, gallops RESPIRATORY:  Clear to auscultation without rales, wheezing or rhonchi  ABDOMEN: Soft, non-tender, non-distended EXTREMITIES:  No edema; No deformity   ASSESSMENT AND PLAN: .     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 Blood pressure and heart rate are stable. - Encouraged hydration with a goal of  64 ounces of fluid per day, starting with 32 ounces if current intake is low.  Mild aortic valve regurgitation Mild aortic valve regurgitation with normal heart function and no symptoms of heart failure. - No need to repeat echocardiogram at this time.  Left ventricular hypertrophy Left ventricular hypertrophy consistent with previous findings, unchanged from prior assessments.  Hypertension Blood pressure well-controlled with current medication regimen.  Type 2 diabetes mellitus with elevated A1c Hemoglobin A1c slightly elevated at 6.8%.  General Health Maintenance Cholesterol levels well-controlled. Advised avoidance of NSAIDs to protect kidney function. - Continue current cholesterol management. - Avoid NSAIDs; Tylenol  is  acceptable for pain management.  Follow-up Follow-up care coordination discussed, including assignment to a new female provider. - Coordinate assignment to a new female provider and send notification. - Schedule follow-up appointment with primary care on October 12, 2023. - Ensure all medication refills are provided.    Dispo: Follow-up in 6 months with Dr. Sheena.  Signed, Orren LOISE Fabry, PA-C

## 2023-10-02 ENCOUNTER — Encounter: Payer: Self-pay | Admitting: Physician Assistant

## 2023-10-02 ENCOUNTER — Ambulatory Visit: Attending: Physician Assistant | Admitting: Physician Assistant

## 2023-10-02 VITALS — BP 118/78 | HR 67 | Ht 61.0 in | Wt 168.8 lb

## 2023-10-02 DIAGNOSIS — N1832 Chronic kidney disease, stage 3b: Secondary | ICD-10-CM

## 2023-10-02 DIAGNOSIS — I340 Nonrheumatic mitral (valve) insufficiency: Secondary | ICD-10-CM

## 2023-10-02 DIAGNOSIS — I2510 Atherosclerotic heart disease of native coronary artery without angina pectoris: Secondary | ICD-10-CM

## 2023-10-02 DIAGNOSIS — I428 Other cardiomyopathies: Secondary | ICD-10-CM

## 2023-10-02 DIAGNOSIS — I351 Nonrheumatic aortic (valve) insufficiency: Secondary | ICD-10-CM

## 2023-10-02 DIAGNOSIS — I10 Essential (primary) hypertension: Secondary | ICD-10-CM

## 2023-10-02 DIAGNOSIS — Z79899 Other long term (current) drug therapy: Secondary | ICD-10-CM

## 2023-10-02 DIAGNOSIS — E782 Mixed hyperlipidemia: Secondary | ICD-10-CM

## 2023-10-02 MED ORDER — NITROGLYCERIN 0.4 MG SL SUBL: SUBLINGUAL_TABLET | SUBLINGUAL | 3 refills | Status: AC

## 2023-10-02 MED ORDER — SPIRONOLACTONE 25 MG PO TABS: ORAL_TABLET | ORAL | 3 refills | Status: AC

## 2023-10-02 MED ORDER — METOPROLOL SUCCINATE ER 25 MG PO TB24: 12.5000 mg | ORAL_TABLET | Freq: Every day | ORAL | 3 refills | Status: AC

## 2023-10-02 MED ORDER — FUROSEMIDE 40 MG PO TABS: 40.0000 mg | ORAL_TABLET | ORAL | 3 refills | Status: AC | PRN

## 2023-10-02 MED ORDER — ROSUVASTATIN CALCIUM 40 MG PO TABS: 40.0000 mg | ORAL_TABLET | Freq: Every day | ORAL | 3 refills | Status: AC

## 2023-10-02 NOTE — Patient Instructions (Addendum)
 Medication Instructions:  Your refills have been sent to your pharmacy.   *If you need a refill on your cardiac medications before your next appointment, please call your pharmacy*   Lab Work: No labs were ordered during today's visit.  If you have labs (blood work) drawn today and your tests are completely normal, you will receive your results only by: MyChart Message (if you have MyChart) OR A paper copy in the mail If you have any lab test that is abnormal or we need to change your treatment, we will call you to review the results.   Testing/Procedures: No procedures were ordered during today's visit.    Follow-Up: At [facility], you and your health needs are our priority.  As part of our continuing mission to provide you with exceptional heart care, we have created designated Provider Care Teams.  These Care Teams include your primary Cardiologist (physician) and Advanced Practice Providers (APPs -  Physician Assistants and Nurse Practitioners) who all work together to provide you with the care you need, when you need it.  We recommend signing up for the patient portal called MyChart.  Sign up information is provided on this After Visit Summary.  MyChart is used to connect with patients for Virtual Visits (Telemedicine).  Patients are able to view lab/test results, encounter notes, upcoming appointments, etc.  Non-urgent messages can be sent to your provider as well.   To learn more about what you can do with MyChart, go to [email or web address].    Your next appointment:   6 month(s)  Provider:   Kardie Tobb, MD    Other Instructions Thank you for choosing  [facility]!
